# Patient Record
Sex: Female | Born: 1975 | Race: Black or African American | Hispanic: No | Marital: Married | Smoking: Never smoker
Health system: Southern US, Community
[De-identification: ages and names within clinical notes are randomized; demographics above are authoritative.]

## PROBLEM LIST (undated history)

## (undated) ENCOUNTER — Inpatient Hospital Stay (HOSPITAL_COMMUNITY): Payer: Self-pay

## (undated) DIAGNOSIS — R51 Headache: Secondary | ICD-10-CM

## (undated) DIAGNOSIS — F3281 Premenstrual dysphoric disorder: Secondary | ICD-10-CM

## (undated) DIAGNOSIS — F419 Anxiety disorder, unspecified: Secondary | ICD-10-CM

## (undated) DIAGNOSIS — K219 Gastro-esophageal reflux disease without esophagitis: Secondary | ICD-10-CM

## (undated) HISTORY — DX: Premenstrual dysphoric disorder: F32.81

## (undated) HISTORY — PX: NO PAST SURGERIES: SHX2092

---

## 2001-10-24 ENCOUNTER — Encounter: Payer: Self-pay | Admitting: Obstetrics

## 2001-10-24 ENCOUNTER — Ambulatory Visit (HOSPITAL_COMMUNITY): Admission: RE | Admit: 2001-10-24 | Discharge: 2001-10-24 | Payer: Self-pay | Admitting: Obstetrics

## 2001-12-20 ENCOUNTER — Ambulatory Visit (HOSPITAL_COMMUNITY): Admission: RE | Admit: 2001-12-20 | Discharge: 2001-12-20 | Payer: Self-pay | Admitting: Obstetrics

## 2001-12-20 ENCOUNTER — Encounter: Payer: Self-pay | Admitting: Obstetrics

## 2002-04-05 ENCOUNTER — Inpatient Hospital Stay (HOSPITAL_COMMUNITY): Admission: AD | Admit: 2002-04-05 | Discharge: 2002-04-05 | Payer: Self-pay | Admitting: Obstetrics

## 2002-04-08 ENCOUNTER — Encounter: Payer: Self-pay | Admitting: Obstetrics

## 2002-04-08 ENCOUNTER — Inpatient Hospital Stay (HOSPITAL_COMMUNITY): Admission: AD | Admit: 2002-04-08 | Discharge: 2002-04-08 | Payer: Self-pay | Admitting: Obstetrics

## 2002-05-13 ENCOUNTER — Inpatient Hospital Stay (HOSPITAL_COMMUNITY): Admission: AD | Admit: 2002-05-13 | Discharge: 2002-05-15 | Payer: Self-pay | Admitting: Obstetrics & Gynecology

## 2004-01-15 ENCOUNTER — Emergency Department (HOSPITAL_COMMUNITY): Admission: EM | Admit: 2004-01-15 | Discharge: 2004-01-15 | Payer: Self-pay | Admitting: Emergency Medicine

## 2010-12-02 ENCOUNTER — Other Ambulatory Visit: Payer: Self-pay | Admitting: Obstetrics

## 2011-07-07 ENCOUNTER — Ambulatory Visit (INDEPENDENT_AMBULATORY_CARE_PROVIDER_SITE_OTHER): Payer: PRIVATE HEALTH INSURANCE | Admitting: Family Medicine

## 2011-07-07 ENCOUNTER — Ambulatory Visit: Payer: PRIVATE HEALTH INSURANCE

## 2011-07-07 VITALS — BP 113/73 | HR 76 | Temp 98.9°F | Resp 12 | Ht 63.0 in | Wt 185.0 lb

## 2011-07-07 DIAGNOSIS — R221 Localized swelling, mass and lump, neck: Secondary | ICD-10-CM

## 2011-07-07 DIAGNOSIS — R220 Localized swelling, mass and lump, head: Secondary | ICD-10-CM

## 2011-07-07 MED ORDER — MELOXICAM 15 MG PO TABS: 15.0000 mg | ORAL_TABLET | Freq: Every day | ORAL | Status: DC

## 2011-07-07 NOTE — Progress Notes (Signed)
  Subjective:    Patient ID: Kirsten Thomas, female    DOB: 04-Aug-1975, 36 y.o.   MRN: [id number]  HPI Presents for evaluation of bilateral lower neck swelling.  "I've been sick on and off since last month with a cold."  Started again last week-ST, runny nose, tired/weak.  OTC products with some improvement, temporarily.  Cough worse at night.  NP at work gave her cough syrup (really strong, causes grogginess in AM).  Neck puffiness noticed by daughter this am.  Saw NP at work-drew bloodwork.  Doesn't know what tests.  Started on Prozac for PMDD several months ago.  Recent colonoscopy. Meds and allergies noted.  Review of Systems As above.  No chest pain, SOB, HA, dizziness, vision change, N/V, diarrhea, dysuria, myalgias, arthralgias or rash. URI-type symptoms are currently resolved.    Objective:   Physical Exam Vital signs noted. Well-developed, well nourished BF who is awake, alert and oriented, in NAD. HEENT: [place]/AT, PERRL, EOMI.  Sclera and conjunctiva are clear.  EAC are patent, TMs are normal in appearance. Nasal mucosa is pink and moist. OP is clear. Neck: supple, non-tender, no lymphadenopathy, thyromegaly.  Small fullness bilaterally at the base of the neck just above the clavicles. Heart: RRR, no murmur Lungs: CTA Abdomen: normo-active bowel sounds, supple, non-tender, no mass or organomegaly. Extremities: no cyanosis, clubbing or edema. Skin: warm and dry without rash.  Soft Tissue lateral neck: [facility] reading (PRIMARY) by  Dr. Hal Hope.  Normal soft tissue neck.     Assessment & Plan:  Neck swelling.  Likely supraclavicular fat pads.  Reassured by the plain film.  Await labs drawn at work.  Discussed with Dr. Hal Hope.

## 2011-07-07 NOTE — Patient Instructions (Signed)
Wait for the lab results from work.  If they are normal and your neck swelling persists, follow-up with your primary care provider or return here for re-evaluation.

## 2011-07-08 ENCOUNTER — Encounter: Payer: Self-pay | Admitting: Physician Assistant

## 2011-11-08 ENCOUNTER — Ambulatory Visit (INDEPENDENT_AMBULATORY_CARE_PROVIDER_SITE_OTHER): Payer: PRIVATE HEALTH INSURANCE | Admitting: Internal Medicine

## 2011-11-08 VITALS — BP 104/70 | HR 70 | Temp 99.7°F | Resp 16 | Ht 63.75 in | Wt 196.4 lb

## 2011-11-08 DIAGNOSIS — E669 Obesity, unspecified: Secondary | ICD-10-CM

## 2011-11-08 MED ORDER — PHENTERMINE HCL 37.5 MG PO CAPS: 37.5000 mg | ORAL_CAPSULE | ORAL | Status: AC

## 2011-11-08 NOTE — Progress Notes (Signed)
Eats when stressed.  Been awhile since she had weighed what she wants to.  Weighed 150 when she graduated high school.  Recently obtained a gym membership.  Eating habits are second helper type of person.  Needs help to get her motivated.  Weighs 1/2 lb less than she weighed when she gave birth.  Does have fatigue.  No other problems related to weight.  prozac and retin a, ortho trycicline.  Thyroid gland is good, no problems.  No swelling in legs.  Knee swells some.  Suggested changing machines at gym, swimming and step stool.  Advise to get small plate, 3 meals a day and no snacks.  No white food, no fried foods.  One hour a day of exercise.  Eating right and changing snack help.  Should be able to loose 3 to 4 lbs per month.  Patient was hoping for medicine to help loose weight.patient took phentermine several years ago and lost a lot of weight.  York Spaniel it gave her a boost to loose the weight.

## 2011-11-08 NOTE — Progress Notes (Signed)
  Subjective:    Patient ID: Kirsten Thomas, female    DOB: 07/16/1975, 36 y.o.   MRN: [id number]  HPIEats when stressed.  Been awhile since she had weighed what she wants to.  Weighed 150 when she graduated high school.  Recently obtained a gym membership.  Eating habits are second helper type of person.  Needs help to get her motivated.  Weighs 1/2 lb less than she weighed when she gave birth.  Does have fatigue.  No other problems related to weight.  prozac and retin a, ortho trycicline.  Thyroid gland is good, no problems.  No swelling in legs.  Knee swells some.  Suggested changing machines at gym, swimming and step stool.  Advise to get small plate, 3 meals a day and no snacks.  No white food, no fried foods.  One hour a day of exercise.  Eating right and changing snack help.  Should be able to loose 3 to 4 lbs per month.  Patient was hoping for medicine to help loose weight.patient took phentermine several years ago and lost a lot of weight.  York Spaniel it gave her a boost to loose the weight.     Review of SystemsNo endocrine or cardiac symptoms     Objective:   Physical Exam Vital signs stable except elevated BMI No thyromegaly or nodules Heart regular without murmur No peripheral edema       Assessment & Plan:  Obesity Meds ordered this encounter  Medications         . phentermine 37.5 MG capsule    Sig: Take 1 capsule (37.5 mg total) by mouth every morning.    Dispense:  30 capsule    Refill:  0  Diet small portions 3 times a day no secondary helpings now between meals snacks no fried foods and no white foods/ Will use phentermine only the first month $100 bet with husband One hour a day of exercise

## 2011-12-01 ENCOUNTER — Other Ambulatory Visit: Payer: Self-pay | Admitting: Obstetrics

## 2012-11-07 ENCOUNTER — Encounter: Payer: Self-pay | Admitting: Obstetrics

## 2012-12-23 ENCOUNTER — Other Ambulatory Visit: Payer: Self-pay | Admitting: Obstetrics

## 2012-12-24 NOTE — Telephone Encounter (Signed)
Please advise 

## 2012-12-27 ENCOUNTER — Encounter: Payer: Self-pay | Admitting: Obstetrics

## 2013-01-01 ENCOUNTER — Ambulatory Visit (INDEPENDENT_AMBULATORY_CARE_PROVIDER_SITE_OTHER): Payer: PRIVATE HEALTH INSURANCE | Admitting: Obstetrics

## 2013-01-01 ENCOUNTER — Encounter: Payer: Self-pay | Admitting: Obstetrics

## 2013-01-01 VITALS — BP 112/76 | HR 76 | Temp 98.2°F | Ht 63.5 in | Wt 204.2 lb

## 2013-01-01 DIAGNOSIS — Z01419 Encounter for gynecological examination (general) (routine) without abnormal findings: Secondary | ICD-10-CM

## 2013-01-01 DIAGNOSIS — Z0000 Encounter for general adult medical examination without abnormal findings: Secondary | ICD-10-CM

## 2013-01-01 MED ORDER — OXYCODONE-ACETAMINOPHEN 10-325 MG PO TABS: 1.0000 | ORAL_TABLET | Freq: Four times a day (QID) | ORAL | Status: DC | PRN

## 2013-01-01 MED ORDER — NORGESTIM-ETH ESTRAD TRIPHASIC 0.18/0.215/0.25 MG-35 MCG PO TABS: ORAL_TABLET | ORAL | Status: DC

## 2013-01-01 MED ORDER — FLUOXETINE HCL 10 MG PO TABS: 10.0000 mg | ORAL_TABLET | Freq: Every day | ORAL | Status: DC

## 2013-01-01 MED ORDER — ALPRAZOLAM 0.25 MG PO TABS: 0.2500 mg | ORAL_TABLET | Freq: Every evening | ORAL | Status: DC | PRN

## 2013-01-01 NOTE — Progress Notes (Signed)
.   Subjective:     Kirsten Thomas is a 37 y.o. female here for a routine exam.  No current complaints.  Personal health questionnaire reviewed: yes.   Gynecologic History Patient's last menstrual period was 12/27/2012. Contraception: OCP (estrogen/progesterone) Last Pap: 11/30/2012. Results were: normal Last mammogram: N/A  Obstetric History OB History  No data available     The following portions of the patient's history were reviewed and updated as appropriate: allergies, current medications, past family history, past medical history, past social history, past surgical history and problem list.  Review of Systems Pertinent items are noted in HPI.    Objective:    General appearance: alert and no distress  No exam.  Consult only.  Assessment:    Healthy female.   Plan:    Follow up in: 1 year.

## 2013-01-30 ENCOUNTER — Other Ambulatory Visit: Payer: Self-pay | Admitting: Obstetrics

## 2013-02-19 ENCOUNTER — Emergency Department (HOSPITAL_COMMUNITY)
Admission: EM | Admit: 2013-02-19 | Discharge: 2013-02-19 | Disposition: A | Discharge status: Home / Self Care | Payer: PRIVATE HEALTH INSURANCE | Attending: Emergency Medicine | Admitting: Emergency Medicine

## 2013-02-19 ENCOUNTER — Emergency Department (HOSPITAL_COMMUNITY): Payer: PRIVATE HEALTH INSURANCE

## 2013-02-19 ENCOUNTER — Encounter (HOSPITAL_COMMUNITY): Payer: Self-pay | Admitting: Emergency Medicine

## 2013-02-19 DIAGNOSIS — Z8742 Personal history of other diseases of the female genital tract: Secondary | ICD-10-CM | POA: Insufficient documentation

## 2013-02-19 DIAGNOSIS — R110 Nausea: Secondary | ICD-10-CM | POA: Insufficient documentation

## 2013-02-19 DIAGNOSIS — Z3202 Encounter for pregnancy test, result negative: Secondary | ICD-10-CM | POA: Insufficient documentation

## 2013-02-19 DIAGNOSIS — R109 Unspecified abdominal pain: Secondary | ICD-10-CM

## 2013-02-19 DIAGNOSIS — Z880 Allergy status to penicillin: Secondary | ICD-10-CM | POA: Insufficient documentation

## 2013-02-19 DIAGNOSIS — N201 Calculus of ureter: Secondary | ICD-10-CM | POA: Insufficient documentation

## 2013-02-19 DIAGNOSIS — N132 Hydronephrosis with renal and ureteral calculous obstruction: Secondary | ICD-10-CM

## 2013-02-19 DIAGNOSIS — Z79899 Other long term (current) drug therapy: Secondary | ICD-10-CM | POA: Insufficient documentation

## 2013-02-19 DIAGNOSIS — N1330 Unspecified hydronephrosis: Secondary | ICD-10-CM | POA: Insufficient documentation

## 2013-02-19 LAB — URINALYSIS, ROUTINE W REFLEX MICROSCOPIC
Bilirubin Urine: NEGATIVE
Glucose, UA: NEGATIVE mg/dL
Ketones, ur: 40 mg/dL — AB
Nitrite: NEGATIVE
Protein, ur: NEGATIVE mg/dL
Specific Gravity, Urine: 1.018 (ref 1.005–1.030)
Urobilinogen, UA: 0.2 mg/dL (ref 0.0–1.0)
pH: 7 (ref 5.0–8.0)

## 2013-02-19 LAB — CBC
HCT: 33.8 % — ABNORMAL LOW (ref 36.0–46.0)
Hemoglobin: 11.4 g/dL — ABNORMAL LOW (ref 12.0–15.0)
MCH: 29.4 pg (ref 26.0–34.0)
MCHC: 33.7 g/dL (ref 30.0–36.0)
MCV: 87.1 fL (ref 78.0–100.0)
Platelets: 278 K/uL (ref 150–400)
RBC: 3.88 MIL/uL (ref 3.87–5.11)
RDW: 12.6 % (ref 11.5–15.5)
WBC: 10.3 K/uL (ref 4.0–10.5)

## 2013-02-19 LAB — BASIC METABOLIC PANEL
Calcium: 8 mg/dL — ABNORMAL LOW (ref 8.4–10.5)
GFR calc Af Amer: >90 mL/min (ref >90)
GFR calc non Af Amer: >90 mL/min (ref >90)
Glucose, Bld: 118 mg/dL — ABNORMAL HIGH (ref 70–99)
Potassium: 3.7 mEq/L (ref 3.5–5.1)
Sodium: 137 mEq/L (ref 135–145)

## 2013-02-19 LAB — URINE MICROSCOPIC-ADD ON

## 2013-02-19 LAB — POCT PREGNANCY, URINE: Preg Test, Ur: NEGATIVE

## 2013-02-19 MED ORDER — OXYCODONE-ACETAMINOPHEN 5-325 MG PO TABS: 2.0000 | ORAL_TABLET | Freq: Four times a day (QID) | ORAL | Status: DC | PRN

## 2013-02-19 MED ORDER — ONDANSETRON HCL 4 MG PO TABS: 4.0000 mg | ORAL_TABLET | Freq: Four times a day (QID) | ORAL | Status: DC

## 2013-02-19 MED ORDER — TAMSULOSIN HCL 0.4 MG PO CAPS: 0.4000 mg | ORAL_CAPSULE | Freq: Every day | ORAL | Status: DC

## 2013-02-19 MED ORDER — SODIUM CHLORIDE 0.9 % IV BOLUS (SEPSIS)
1000.0000 mL | Freq: Once | INTRAVENOUS | Status: AC
  Administered 2013-02-19: 1000 mL via INTRAVENOUS

## 2013-02-19 MED ORDER — ONDANSETRON HCL 4 MG/2ML IJ SOLN
4.0000 mg | Freq: Once | INTRAMUSCULAR | Status: AC
  Administered 2013-02-19: 4 mg via INTRAVENOUS
  Filled 2013-02-19: qty 2

## 2013-02-19 MED ORDER — KETOROLAC TROMETHAMINE 30 MG/ML IJ SOLN
30.0000 mg | Freq: Once | INTRAMUSCULAR | Status: AC
  Administered 2013-02-19: 30 mg via INTRAVENOUS
  Filled 2013-02-19: qty 1

## 2013-02-19 MED ORDER — HYDROMORPHONE HCL PF 1 MG/ML IJ SOLN
1.0000 mg | Freq: Once | INTRAMUSCULAR | Status: AC
  Administered 2013-02-19: 1 mg via INTRAVENOUS
  Filled 2013-02-19: qty 1

## 2013-02-19 MED ORDER — HYDROCODONE-ACETAMINOPHEN 5-325 MG PO TABS: 2.0000 | ORAL_TABLET | Freq: Four times a day (QID) | ORAL | Status: DC | PRN

## 2013-02-19 MED ORDER — METHYLPREDNISOLONE SODIUM SUCC 125 MG IJ SOLR: 125.0000 mg | Freq: Once | INTRAMUSCULAR | Status: DC

## 2013-02-19 MED ORDER — FAMOTIDINE IN NACL 20-0.9 MG/50ML-% IV SOLN
20.0000 mg | Freq: Once | INTRAVENOUS | Status: AC
  Administered 2013-02-19: 20 mg via INTRAVENOUS
  Filled 2013-02-19: qty 50

## 2013-02-19 NOTE — ED Notes (Signed)
MD at bedside. 

## 2013-02-19 NOTE — ED Notes (Signed)
Pt alert and mentating appropriately upon d/c. Pt given d/c teaching, prescriptions and follow up care instructions. Pt has no further questions upon d/c. Notified Consulting civil engineer of prescription change via Dr Lavella Lemons. Pt ambulatory with steady gait and denies pain upon d/c. Pt instructed not to drive home and endorses her husband is coming to ER to pick her up. NAD noted upon d/c.

## 2013-02-19 NOTE — ED Notes (Signed)
Pt reports pain in abdomen area and left flank area; experiences nausea with some emesis; pt reports feeling the need to urinate more than usual. Pt reports hx UTI but denies hx of Kidney Stones; Pt onset is last evening

## 2013-02-19 NOTE — ED Notes (Signed)
Pt states left flank pain for the past day. Pt states difficulty urinating and that the pain gets better after she urinates. Pt states nausea as well.

## 2013-02-19 NOTE — ED Provider Notes (Signed)
CSN: [id number]     Arrival date & time 02/19/13  0239 History   First MD Initiated Contact with Patient 02/19/13 0251     Chief Complaint  Patient presents with  . Flank Pain   (Consider location/radiation/quality/duration/timing/severity/associated sxs/prior Treatment) HPI  This patient is a generally healthy 74 old woman who presents with approximately 8 hours of severe left flank pain radiating to left lower quadrant of the abdomen. Her pain is severe, 10 over 10. The patient has been unable to sleep secondary to pain. She denies history of similar symptoms. No recent trauma or strain. She's been nauseated but denies vomiting. No fever.  The patient has had to strain to pass urine but denies painful dysuria and gross hematuria. Her last menstrual period was approximately 2 weeks ago.   This patient is a generally healthy 6 her old woman whoPast Medical History  Diagnosis Date  . PMDD (premenstrual dysphoric disorder)    History reviewed. No pertinent past surgical history. History reviewed. No pertinent family history. History  Substance Use Topics  . Smoking status: Never Smoker   . Smokeless tobacco: Not on file  . Alcohol Use: Not on file   OB History   Grav Para Term Preterm Abortions TAB SAB Ect Mult Living                 Review of Systems Gen: no weight loss, fevers, chills, night sweats Eyes: no discharge or drainage, no occular pain or visual changes Nose: no epistaxis or rhinorrhea Mouth: no dental pain, no sore throat Neck: no neck pain Lungs: no SOB, cough, wheezing CV: no chest pain, palpitations, dependent edema or orthopnea Abd: As per history of present illness, otherwise negative GU: As per history of present illness, otherwise negative MSK: no myalgias or arthralgias Neuro: no headache, no focal neurologic deficits Skin: no rash Psyche: negative.  Allergies  Penicillins  Home Medications   Current Outpatient Rx  Name  Route  Sig  Dispense   Refill  . clindamycin-benzoyl peroxide (BENZACLIN) gel   Topical   Apply 1 application topically daily.         Marland Kitchen FLUoxetine (PROZAC) 10 MG tablet   Oral   Take 1 tablet (10 mg total) by mouth daily.   30 tablet   11   . Norgestimate-Ethinyl Estradiol Triphasic 0.18/0.215/0.25 MG-35 MCG tablet      Take 1 tablet po daily as directed.   1 Package   11    BP 117/78  Pulse 82  Temp(Src) 98.1 F (36.7 C) (Oral)  Resp 16  SpO2 99%  LMP 02/12/2013 Physical Exam Gen: well developed and well nourished appearing, appears uncomfortable Head: NCAT Eyes: PERL, EOMI Nose: no epistaixis or rhinorrhea Mouth/throat: mucosa is moist and pink Neck: supple, no stridor Lungs: CTA B, no wheezing, rhonchi or rales CV: Regular rate and rhythm, no murmur Abd: soft, notender, nondistended Back: no ttp, no cva ttp Skin: no rashese, wnl Neuro: CN ii-xii grossly intact, no focal deficits Psyche; normal affect,  calm and cooperative.   ED Course  Procedures (including critical care time) Labs Review  Results for orders placed during the hospital encounter of 02/19/13 (from the past 24 hour(s))  URINALYSIS, ROUTINE W REFLEX MICROSCOPIC     Status: Abnormal   Collection Time    02/19/13  2:42 AM      Result Value Range   Color, Urine YELLOW  YELLOW   APPearance CLEAR  CLEAR   Specific Gravity, Urine  1.018  1.005 - 1.030   pH 7.0  5.0 - 8.0   Glucose, UA NEGATIVE  NEGATIVE mg/dL   Hgb urine dipstick MODERATE (*) NEGATIVE   Bilirubin Urine NEGATIVE  NEGATIVE   Ketones, ur 40 (*) NEGATIVE mg/dL   Protein, ur NEGATIVE  NEGATIVE mg/dL   Urobilinogen, UA 0.2  0.0 - 1.0 mg/dL   Nitrite NEGATIVE  NEGATIVE   Leukocytes, UA SMALL (*) NEGATIVE  URINE MICROSCOPIC-ADD ON     Status: Abnormal   Collection Time    02/19/13  2:42 AM      Result Value Range   Squamous Epithelial / LPF FEW (*) RARE   WBC, UA 3-6  <3 WBC/hpf   RBC / HPF 7-10  <3 RBC/hpf   Bacteria, UA FEW (*) RARE   Urine-Other  MUCOUS PRESENT    POCT PREGNANCY, URINE     Status: None   Collection Time    02/19/13  2:51 AM      Result Value Range   Preg Test, Ur NEGATIVE  NEGATIVE  CBC     Status: Abnormal   Collection Time    02/19/13  5:15 AM      Result Value Range   WBC 10.3  4.0 - 10.5 K/uL   RBC 3.88  3.87 - 5.11 MIL/uL   Hemoglobin 11.4 (*) 12.0 - 15.0 g/dL   HCT 30.8 (*) 65.7 - 84.6 %   MCV 87.1  78.0 - 100.0 fL   MCH 29.4  26.0 - 34.0 pg   MCHC 33.7  30.0 - 36.0 g/dL   RDW 96.2  95.2 - 84.1 %   Platelets 278  150 - 400 K/uL  BASIC METABOLIC PANEL     Status: Abnormal   Collection Time    02/19/13  5:15 AM      Result Value Range   Sodium 137  135 - 145 mEq/L   Potassium 3.7  3.5 - 5.1 mEq/L   Chloride 99  96 - 112 mEq/L   CO2 24  19 - 32 mEq/L   Glucose, Bld 118 (*) 70 - 99 mg/dL   BUN 11  6 - 23 mg/dL   Creatinine, Ser 3.24  0.50 - 1.10 mg/dL   Calcium 8.0 (*) 8.4 - 10.5 mg/dL   GFR calc non Af Amer >90  >90 mL/min   GFR calc Af Amer >90  >90 mL/min     Imaging Review  EXAM: CT ABDOMEN AND PELVIS WITHOUT CONTRAST  TECHNIQUE: Multidetector CT imaging of the abdomen and pelvis was performed following the standard protocol without intravenous contrast.  COMPARISON: None.  FINDINGS: BODY WALL: Unremarkable.  LOWER CHEST:  Mediastinum: Unremarkable.  Lungs/pleura: No consolidation.  ABDOMEN/PELVIS:  Liver: No focal abnormality.  Biliary: No evidence of biliary obstruction or stone.  Pancreas: Unremarkable.  Spleen: Unremarkable.  Adrenals: Unremarkable.  Kidneys and ureters: Left hydroureteronephrosis with left renomegaly and extensive left perinephric edema- secondary to a 6 mm left ureteral vesicular stone. No intrarenal calculi seen.  Bladder: Unremarkable.  Bowel: No obstruction. Normal appendix.  Retroperitoneum: No mass or adenopathy.  Peritoneum: No free fluid or gas.  Reproductive: Dominant follicle in the left ovary, 2.3 cm.  Vascular: No acute  abnormality.  OSSEOUS: No acute abnormalities. No suspicious lytic or blastic lesions.  IMPRESSION: 1. 6 mm left ureteral vesicular junction calculus with obstructive uropathy. 2. No intrarenal calculi.   Electronically Signed By: Tiburcio Pea M.D. On: 02/19/2013 04:50    EKG Interpretation  None       MDM  DDX: renal colic, pyelonephritis, UTI, ovarian cyst, ovarian torsion, colitis, PID.   Patient with obstructive UVJ stone. Normal renal function. Feeling much better after tx with IV analgesia, IVF, antiemetic. I have informed the patient of her diagnosis, plan for outpatient tx and Urology follow up. Case briefly discussed with Dr. Margarita Grizzle, Urologist on call. He will be happy to see the patient in follow up this week.   Patient counseled re: return precautions.       Brandt Loosen, MD 02/19/13 [phone]

## 2013-02-20 LAB — URINE CULTURE
Colony Count: NO GROWTH
Culture: NO GROWTH

## 2013-02-22 ENCOUNTER — Other Ambulatory Visit: Payer: Self-pay | Admitting: Urology

## 2013-02-28 ENCOUNTER — Encounter (HOSPITAL_COMMUNITY): Payer: Self-pay | Admitting: Pharmacy Technician

## 2013-03-04 ENCOUNTER — Inpatient Hospital Stay (HOSPITAL_COMMUNITY): Admission: RE | Admit: 2013-03-04 | Payer: PRIVATE HEALTH INSURANCE | Source: Ambulatory Visit

## 2013-03-05 ENCOUNTER — Encounter (HOSPITAL_COMMUNITY): Payer: Self-pay

## 2013-03-05 ENCOUNTER — Encounter (HOSPITAL_COMMUNITY)
Admission: RE | Admit: 2013-03-05 | Discharge: 2013-03-05 | Disposition: A | Discharge status: Home / Self Care | Payer: PRIVATE HEALTH INSURANCE | Source: Ambulatory Visit | Attending: Urology | Admitting: Urology

## 2013-03-05 HISTORY — DX: Anxiety disorder, unspecified: F41.9

## 2013-03-05 HISTORY — DX: Gastro-esophageal reflux disease without esophagitis: K21.9

## 2013-03-05 HISTORY — DX: Headache: R51

## 2013-03-05 LAB — BASIC METABOLIC PANEL
BUN: 11 mg/dL (ref 6–23)
Chloride: 103 mEq/L (ref 96–112)
GFR calc Af Amer: >90 mL/min (ref >90)
GFR calc non Af Amer: >90 mL/min (ref >90)
Glucose, Bld: 82 mg/dL (ref 70–99)
Potassium: 3.7 mEq/L (ref 3.5–5.1)

## 2013-03-05 NOTE — Patient Instructions (Signed)
Kirsten Thomas  03/05/2013   Your procedure is scheduled on: 03/07/13  Report to [facility] at 8:30 AM.  Call this number if you have problems the morning of surgery [phone]: [phone]   Remember:   Do not eat food or drink liquids After Midnight.     Take these medicines the morning of surgery with A SIP OF WATER: prozac   Do not wear jewelry, make-up or nail polish.  Do not wear lotions, powders, or perfumes. You may wear deodorant.  Do not shave 48 hours prior to surgery. Men may shave face and neck.  Do not bring valuables to the hospital.  Contacts, dentures or bridgework may not be worn into surgery.   Patients discharged the day of surgery will not be allowed to drive home.  Name and phone number of your driver: [name]    Birdie Sons, RN  pre op nurse call if needed [phone]    FAILURE TO FOLLOW THESE INSTRUCTIONS MAY RESULT IN CANCELLATION OF YOUR SURGERY   Patient Signature: ___________________________________________

## 2013-03-05 NOTE — Progress Notes (Signed)
CBC and urine preg 02/19/13 on EPIC

## 2013-03-06 NOTE — H&P (Signed)
Chief Complaint  Kidney stone   History of Present Illness  Kirsten Thomas is a very pleasant 37 year old female who began developing intermittent left-sided flank pain approximately 2 weeks ago.  She then remained pain-free until Monday when she developed severe nausea and vomiting and severe left-sided flank pain causing her to present to the emergency department early Tuesday morning.  She underwent a CT scan of the abdomen and pelvis without contrast which demonstrated a 6 mm left distal ureteral calculus.  No other stones were identified.  She has no prior history of kidney stones.  She was administered pain medication and discharged home on Percocet and tamsulosin for medical expulsion therapy.  Her pain has been relatively well controlled since her discharge and she has been tolerating oral intake.  She does have some mild dysuria and straining to urinate.  There is no family history of kidney stones.  She currently denies any fever, nausea, or vomiting.   Past Medical History Problems  1. History of  Anxiety (Symptom) 300.00 2. History of  Esophageal Reflux 530.81 3. History of  Premenstrual Disorder 625.4  Surgical History Problems  1. History of  No Surgical Problems  Current Meds 1. Clindamycin Phosphate 1 % External Gel; Therapy: (Recorded:15Oct2014) to 2. Ortho Tri-Cyclen (28) 0.18/0.215/0.25 MG-35 MCG Oral Tablet; Therapy:  (Recorded:15Oct2014) to 3. PROzac 10 MG Oral Capsule; Therapy: (Recorded:15Oct2014) to  Allergies Medication  1. Penicillins  Family History Problems  1. Family history of  Diabetes Mellitus V18.0 2. Maternal history of  Paranoid Schizophrenia  Social History Problems    Alcohol Use up to 3 glasses of wine weekly; not an every day drinker   Caffeine Use 1 or 2 servings daily   Marital History - Currently Married   Never A Smoker  Review of Systems Genitourinary, constitutional, skin, eye, otolaryngeal, hematologic/lymphatic, cardiovascular,  pulmonary, endocrine, musculoskeletal, gastrointestinal, neurological and psychiatric system(s) were reviewed and pertinent findings if present are noted.  Genitourinary: no hematuria.  Gastrointestinal: nausea, vomiting and heartburn.  Constitutional: feeling tired (fatigue) and recent weight loss.  Respiratory: shortness of breath and cough.  Musculoskeletal: back pain.  Neurological: dizziness.    Vitals Vital Signs [Data Includes: Last 1 Day]  15Oct2014 11:09AM  BMI Calculated: 34.55 BSA Calculated: 1.91 Height: 5 ft 3 in Weight: 195 lb  Blood Pressure: 104 / 69 Temperature: 98.9 F Heart Rate: 88  Physical Exam Constitutional: Well nourished and well developed . No acute distress.  ENT:. The ears and nose are normal in appearance.  Neck: The appearance of the neck is normal and no neck mass is present.  Pulmonary: No respiratory distress, normal respiratory rhythm and effort and clear bilateral breath sounds.  Cardiovascular: Heart rate and rhythm are normal . No peripheral edema.  Abdomen: The abdomen is soft and nontender. No masses are palpated. moderate left CVA tenderness. No hernias are palpable. No hepatosplenomegaly noted.  Lymphatics: The femoral and inguinal nodes are not enlarged or tender.  Skin: Normal skin turgor, no visible rash and no visible skin lesions.  Neuro/Psych:. Mood and affect are appropriate.    Results/Data Urine [Data Includes: Last 1 Day]   15Oct2014  COLOR YELLOW   APPEARANCE CLOUDY   SPECIFIC GRAVITY 1.010   pH 6.0   GLUCOSE NEG mg/dL  BILIRUBIN NEG   KETONE NEG mg/dL  BLOOD LARGE   PROTEIN NEG mg/dL  UROBILINOGEN 0.2 mg/dL  NITRITE NEG   LEUKOCYTE ESTERASE SMALL   SQUAMOUS EPITHELIAL/HPF MANY   WBC 11-20 WBC/hpf  RBC 3-6 RBC/hpf  BACTERIA MODERATE   CRYSTALS NONE SEEN   CASTS NONE SEEN     I reviewed her medical records including her labs from the hospital.  Her creatinine 0.78, white blood count 10 point 3, urine pregnancy was  negative.  Urinalysis demonstrated 3-6 white blood cells, 7 and 10 red blood cells but her culture was negative.  I independently reviewed her CT scan findings as dictated above.   Assessment Assessed  1. Ureteral Stone 592.1  Plan Health Maintenance (V70.0)  1. UA With REFLEX  Done: 15Oct2014 10:53AM Ureteral Stone (592.1)  2. Oxycodone-Acetaminophen 5-325 MG Oral Tablet; Take 1-2 tablets every 4-6 hrs;  Therapy: 15Oct2014 to (Complete:14Nov2014); Last Rx:15Oct2014  Discussion/Summary  1.  Distal left ureteral calculus: We reviewed options including medical expulsion, shockwave lithotripsy, ureteroscopic laser lithotripsy, and percutaneous approaches to treatment of kidney stones.  We reviewed her incontinence of each approach.  She does elect to proceed with an initial course of medical expulsion therapy and will strain her urine.  She will continue tamsulosin and has been provided a prescription for Percocet.  She will tentatively be scheduled for left ureteroscopic laser lithotripsy and stone removal in 2 weeks if she does not pass her stone.  We have reviewed the potential risks and consultation for this procedure as well as the expected recovery process.  She will notify should she develop fever or unrelenting pain prior to that time.  Cc: Dr. Laurann Montana     Signatures Electronically signed by : Heloise Purpura, M.D.; Feb 20 2013 12:09PM

## 2013-03-07 ENCOUNTER — Ambulatory Visit (HOSPITAL_COMMUNITY): Payer: PRIVATE HEALTH INSURANCE | Admitting: Anesthesiology

## 2013-03-07 ENCOUNTER — Ambulatory Visit (HOSPITAL_COMMUNITY)
Admission: RE | Admit: 2013-03-07 | Discharge: 2013-03-07 | Disposition: A | Discharge status: Home / Self Care | Payer: PRIVATE HEALTH INSURANCE | Source: Ambulatory Visit | Attending: Urology | Admitting: Urology

## 2013-03-07 ENCOUNTER — Encounter (HOSPITAL_COMMUNITY): Payer: PRIVATE HEALTH INSURANCE | Admitting: Anesthesiology

## 2013-03-07 ENCOUNTER — Encounter (HOSPITAL_COMMUNITY)
Admission: RE | Disposition: A | Discharge status: Home / Self Care | Payer: Self-pay | Source: Ambulatory Visit | Attending: Urology

## 2013-03-07 ENCOUNTER — Encounter (HOSPITAL_COMMUNITY): Payer: Self-pay | Admitting: Anesthesiology

## 2013-03-07 DIAGNOSIS — K219 Gastro-esophageal reflux disease without esophagitis: Secondary | ICD-10-CM | POA: Insufficient documentation

## 2013-03-07 DIAGNOSIS — N201 Calculus of ureter: Secondary | ICD-10-CM | POA: Insufficient documentation

## 2013-03-07 HISTORY — PX: HOLMIUM LASER APPLICATION: SHX5852

## 2013-03-07 HISTORY — PX: CYSTOSCOPY WITH RETROGRADE PYELOGRAM, URETEROSCOPY AND STENT PLACEMENT: SHX5789

## 2013-03-07 SURGERY — CYSTOURETEROSCOPY, WITH RETROGRADE PYELOGRAM AND STENT INSERTION
Anesthesia: General | Laterality: Left | Wound class: Clean Contaminated

## 2013-03-07 MED ORDER — FENTANYL CITRATE 0.05 MG/ML IJ SOLN
INTRAMUSCULAR | Status: DC | PRN
  Administered 2013-03-07 (×2): 25 ug via INTRAVENOUS
  Administered 2013-03-07: 50 ug via INTRAVENOUS

## 2013-03-07 MED ORDER — HYDROCODONE-ACETAMINOPHEN 5-325 MG PO TABS: 1.0000 | ORAL_TABLET | Freq: Four times a day (QID) | ORAL | Status: DC | PRN

## 2013-03-07 MED ORDER — SODIUM CHLORIDE 0.9 % IR SOLN
Status: DC | PRN
  Administered 2013-03-07: 3000 mL

## 2013-03-07 MED ORDER — CIPROFLOXACIN IN D5W 400 MG/200ML IV SOLN
400.0000 mg | INTRAVENOUS | Status: AC
  Administered 2013-03-07: 400 mg via INTRAVENOUS

## 2013-03-07 MED ORDER — ONDANSETRON HCL 4 MG/2ML IJ SOLN
INTRAMUSCULAR | Status: DC | PRN
  Administered 2013-03-07: 4 mg via INTRAVENOUS

## 2013-03-07 MED ORDER — 0.9 % SODIUM CHLORIDE (POUR BTL) OPTIME
TOPICAL | Status: DC | PRN
  Administered 2013-03-07: 1000 mL

## 2013-03-07 MED ORDER — MEPERIDINE HCL 50 MG/ML IJ SOLN: 6.2500 mg | INTRAMUSCULAR | Status: DC | PRN

## 2013-03-07 MED ORDER — MIDAZOLAM HCL 5 MG/5ML IJ SOLN
INTRAMUSCULAR | Status: DC | PRN
  Administered 2013-03-07 (×2): 1 mg via INTRAVENOUS

## 2013-03-07 MED ORDER — CIPROFLOXACIN IN D5W 400 MG/200ML IV SOLN
INTRAVENOUS | Status: AC
  Filled 2013-03-07: qty 200

## 2013-03-07 MED ORDER — LACTATED RINGERS IV SOLN
INTRAVENOUS | Status: DC | PRN
  Administered 2013-03-07: 10:00:00 via INTRAVENOUS

## 2013-03-07 MED ORDER — PHENYLEPHRINE HCL 10 MG/ML IJ SOLN
INTRAMUSCULAR | Status: DC | PRN
  Administered 2013-03-07 (×4): 80 ug via INTRAVENOUS

## 2013-03-07 MED ORDER — LACTATED RINGERS IV SOLN: INTRAVENOUS | Status: DC

## 2013-03-07 MED ORDER — HYDROCODONE-ACETAMINOPHEN 5-325 MG PO TABS
1.0000 | ORAL_TABLET | Freq: Four times a day (QID) | ORAL | Status: DC | PRN
  Administered 2013-03-07: 2 via ORAL
  Filled 2013-03-07: qty 2

## 2013-03-07 MED ORDER — PROMETHAZINE HCL 25 MG/ML IJ SOLN: 6.2500 mg | INTRAMUSCULAR | Status: DC | PRN

## 2013-03-07 MED ORDER — SODIUM CHLORIDE 0.9 % IR SOLN
Status: DC | PRN
  Administered 2013-03-07: 1000 mL

## 2013-03-07 MED ORDER — IOHEXOL 300 MG/ML  SOLN
INTRAMUSCULAR | Status: DC | PRN
  Administered 2013-03-07: 10 mL

## 2013-03-07 MED ORDER — PROPOFOL 10 MG/ML IV BOLUS
INTRAVENOUS | Status: DC | PRN
  Administered 2013-03-07: 180 mg via INTRAVENOUS

## 2013-03-07 MED ORDER — FENTANYL CITRATE 0.05 MG/ML IJ SOLN: 25.0000 ug | INTRAMUSCULAR | Status: DC | PRN

## 2013-03-07 SURGICAL SUPPLY — 16 items
BAG URO CATCHER STRL LF (DRAPE) ×2 IMPLANT
BASKET ZERO TIP NITINOL 2.4FR (BASKET) ×2 IMPLANT
CATH INTERMIT  6FR 70CM (CATHETERS) ×2 IMPLANT
CLOTH BEACON ORANGE TIMEOUT ST (SAFETY) ×2 IMPLANT
DRAPE CAMERA CLOSED 9X96 (DRAPES) ×2 IMPLANT
FIBER LASER FLEXIVA 365 (UROLOGICAL SUPPLIES) ×2 IMPLANT
GLOVE BIOGEL M STRL SZ7.5 (GLOVE) ×2 IMPLANT
GLOVE BIOGEL PI IND STRL 7.0 (GLOVE) ×1 IMPLANT
GLOVE BIOGEL PI INDICATOR 7.0 (GLOVE) ×1
GOWN PREVENTION PLUS LG XLONG (DISPOSABLE) ×4 IMPLANT
GOWN STRL NON-REIN LRG LVL3 (GOWN DISPOSABLE) ×2 IMPLANT
GUIDEWIRE ANG ZIPWIRE 038X150 (WIRE) IMPLANT
GUIDEWIRE STR DUAL SENSOR (WIRE) ×2 IMPLANT
MANIFOLD NEPTUNE II (INSTRUMENTS) ×2 IMPLANT
PACK CYSTO (CUSTOM PROCEDURE TRAY) ×2 IMPLANT
TUBING CONNECTING 10 (TUBING) ×2 IMPLANT

## 2013-03-07 NOTE — Op Note (Signed)
Preoperative diagnosis: Left distal ureteral calculus  Postoperative diagnosis: Left distal ureteral calculus  Procedure:  1. Cystoscopy 2. Left ureteroscopy and stone removal 3. Ureteroscopic laser lithotripsy 4. Left retrograde pyelography with interpretation  Surgeon: Moody Bruins. M.D.  Anesthesia: General  Complications: None  Intraoperative findings: Left retrograde pyelography demonstrated a filling defect within the distal left ureter consistent with the patient's known calculus without other abnormalities.  EBL: Minimal  Specimens: 1. Left ureteral calculus  Disposition of specimens: [facility] for stone analysis  Indication: Kirsten Thomas is a 37 y.o. year old patient with a distal left ureteral stone. After reviewing the management options for treatment, the patient elected to proceed with the above surgical procedure(s). We have discussed the potential benefits and risks of the procedure, side effects of the proposed treatment, the likelihood of the patient achieving the goals of the procedure, and any potential problems that might occur during the procedure or recuperation. Informed consent has been obtained.  Description of procedure:  The patient was taken to the operating room and general anesthesia was induced.  The patient was placed in the dorsal lithotomy position, prepped and draped in the usual sterile fashion, and preoperative antibiotics were administered. A preoperative time-out was performed.   Cystourethroscopy was performed.  The patient's urethra was examined and was normal. The bladder was then systematically examined in its entirety. There was no evidence for any bladder tumors, stones, or other mucosal pathology.    Attention then turned to the left ureteral orifice and a ureteral catheter was used to intubate the ureteral orifice.  Omnipaque contrast was injected through the ureteral catheter and a retrograde pyelogram  was performed with findings as dictated above.  A 0.38 sensor guidewire was then advanced up the left ureter into the renal pelvis under fluoroscopic guidance. The 6 Fr semirigid ureteroscope was then advanced into the ureter next to the guidewire and the calculus was identified within the distal left ureter.   The stone was then fragmented with the 365 micron holmium laser fiber on a setting of 0.5 J and frequency of 20 Hz.   All stones were then removed from the ureter with a zero tip nitinol basket.  Reinspection of the ureter revealed no remaining visible stones or fragments.   The wire was then backloaded through the cystoscope and a ureteral stent was advance over the wire using Seldinger technique.  The stent was positioned appropriately under fluoroscopic and cystoscopic guidance.  The wire was then removed with an adequate stent curl noted in the renal pelvis as well as in the bladder.  The bladder was then emptied and the procedure ended.  The patient appeared to tolerate the procedure well and without complications.  The patient was able to be awakened and transferred to the recovery unit in satisfactory condition.

## 2013-03-07 NOTE — Anesthesia Postprocedure Evaluation (Signed)
  Anesthesia Post-op Note  Patient: Kirsten Thomas  Procedure(s) Performed: Procedure(s) (LRB): CYSTOSCOPY WITH RETROGRADE PYELOGRAM, URETEROSCOPY, STONE REMOVAL  (Left)  HOLMIUM LASER APPLICATION (Left)  Patient Location: PACU  Anesthesia Type: General  Level of Consciousness: awake and alert   Airway and Oxygen Therapy: Patient Spontanous Breathing  Post-op Pain: mild  Post-op Assessment: Post-op Vital signs reviewed, Patient's Cardiovascular Status Stable, Respiratory Function Stable, Patent Airway and No signs of Nausea or vomiting  Last Vitals:  Filed Vitals:   03/07/13 1325  BP: 118/68  Pulse: 80  Temp:   Resp: 18    Post-op Vital Signs: stable   Complications: No apparent anesthesia complications

## 2013-03-07 NOTE — Interval H&P Note (Signed)
History and Physical Interval Note:  03/07/2013 10:23 AM  Kirsten Thomas  has presented today for surgery, with the diagnosis of LEFT URETERAL STONE  The various methods of treatment have been discussed with the patient and family. After consideration of risks, benefits and other options for treatment, the patient has consented to  Procedure(s): CYSTOSCOPY WITH RETROGRADE PYELOGRAM, URETEROSCOPY AND STENT PLACEMENT (Left) POSSIBLE HOLMIUM LASER APPLICATION (Left) as a surgical intervention .  The patient's history has been reviewed, patient examined, no change in status, stable for surgery.  I have reviewed the patient's chart and labs.  Questions were answered to the patient's satisfaction.     Wetzel Meester,LES

## 2013-03-07 NOTE — Transfer of Care (Signed)
Immediate Anesthesia Transfer of Care Note  Patient: Kirsten Thomas  Procedure(s) Performed: Procedure(s): CYSTOSCOPY WITH RETROGRADE PYELOGRAM, URETEROSCOPY, STONE REMOVAL  (Left)  HOLMIUM LASER APPLICATION (Left)  Patient Location: PACU  Anesthesia Type:General  Level of Consciousness: awake, alert , oriented and patient cooperative  Airway & Oxygen Therapy: Patient Spontanous Breathing and Patient connected to face mask oxygen  Post-op Assessment: Report given to PACU RN and Post -op Vital signs reviewed and stable  Post vital signs: Reviewed and stable  Complications: No apparent anesthesia complications

## 2013-03-07 NOTE — Anesthesia Preprocedure Evaluation (Signed)
Anesthesia Evaluation  Patient identified by MRN, date of birth, ID band Patient awake    Reviewed: Allergy & Precautions, H&P , NPO status , Patient's Chart, lab work & pertinent test results  Airway Mallampati: II TM Distance: >3 FB Neck ROM: Full    Dental no notable dental hx. (+) Teeth Intact   Pulmonary neg pulmonary ROS,  breath sounds clear to auscultation  Pulmonary exam normal       Cardiovascular negative cardio ROS  Rhythm:Regular Rate:Normal     Neuro/Psych negative neurological ROS  negative psych ROS   GI/Hepatic negative GI ROS, Neg liver ROS,   Endo/Other  negative endocrine ROS  Renal/GU negative Renal ROS  negative genitourinary   Musculoskeletal negative musculoskeletal ROS (+)   Abdominal   Peds negative pediatric ROS (+)  Hematology negative hematology ROS (+)   Anesthesia Other Findings   Reproductive/Obstetrics negative OB ROS                           Anesthesia Physical Anesthesia Plan  ASA: II  Anesthesia Plan: General   Post-op Pain Management:    Induction: Intravenous  Airway Management Planned: LMA  Additional Equipment:   Intra-op Plan:   Post-operative Plan: Extubation in OR  Informed Consent: I have reviewed the patients History and Physical, chart, labs and discussed the procedure including the risks, benefits and alternatives for the proposed anesthesia with the patient or authorized representative who has indicated his/her understanding and acceptance.   Dental advisory given  Plan Discussed with: CRNA  Anesthesia Plan Comments:         Anesthesia Quick Evaluation

## 2013-03-08 ENCOUNTER — Encounter (HOSPITAL_COMMUNITY): Payer: Self-pay | Admitting: Urology

## 2013-06-12 ENCOUNTER — Encounter: Payer: Self-pay | Admitting: Obstetrics

## 2013-07-17 ENCOUNTER — Encounter: Payer: Self-pay | Admitting: Obstetrics

## 2013-07-18 ENCOUNTER — Other Ambulatory Visit: Payer: Self-pay | Admitting: Obstetrics

## 2013-07-23 ENCOUNTER — Encounter: Payer: Self-pay | Admitting: Obstetrics

## 2014-01-31 ENCOUNTER — Other Ambulatory Visit: Payer: Self-pay | Admitting: Obstetrics

## 2014-04-21 ENCOUNTER — Ambulatory Visit: Payer: PRIVATE HEALTH INSURANCE | Admitting: Obstetrics

## 2014-05-13 ENCOUNTER — Telehealth: Payer: Self-pay | Admitting: *Deleted

## 2014-05-13 NOTE — Telephone Encounter (Signed)
Patient is requesting a refill of her birth control. 05/13/2014 11:10 LM on VM - will RF x3- but patient needs to have AEX/pap- please call to schedule.

## 2014-07-07 ENCOUNTER — Ambulatory Visit (INDEPENDENT_AMBULATORY_CARE_PROVIDER_SITE_OTHER): Payer: Commercial Managed Care - PPO | Admitting: Obstetrics

## 2014-07-07 ENCOUNTER — Encounter: Payer: Self-pay | Admitting: Obstetrics

## 2014-07-07 VITALS — BP 119/72 | HR 105 | Temp 98.3°F | Ht 63.5 in | Wt 212.0 lb

## 2014-07-07 DIAGNOSIS — E669 Obesity, unspecified: Secondary | ICD-10-CM | POA: Diagnosis not present

## 2014-07-07 DIAGNOSIS — Z3169 Encounter for other general counseling and advice on procreation: Secondary | ICD-10-CM | POA: Diagnosis not present

## 2014-07-08 ENCOUNTER — Encounter: Payer: Self-pay | Admitting: Obstetrics

## 2014-07-08 NOTE — Progress Notes (Addendum)
Subjective:        Kirsten Thomas is a 39 y.o. female here for a routine exam.  Current complaints: None.    Personal health questionnaire:  Is patient [name], have a family history of breast and/or ovarian cancer: no Is there a family history of uterine cancer diagnosed at age < 9250, gastrointestinal cancer, urinary tract cancer, family member who is a Personnel officerLynch syndrome-associated carrier: no Is the patient overweight and hypertensive, family history of diabetes, personal history of gestational diabetes, preeclampsia or PCOS: no Is patient over 7655, have PCOS,  family history of premature CHD under age 38, diabetes, smoke, have hypertension or peripheral artery disease:  no At any time, has a partner hit, kicked or otherwise hurt or frightened you?: no Over the past 2 weeks, have you felt down, depressed or hopeless?: no Over the past 2 weeks, have you felt little interest or pleasure in doing things?:no   Gynecologic History Patient's last menstrual period was 06/20/2014. Contraception: none Last Pap: 2015. Results were: normal Last mammogram: n/a. Results were: n/a  Obstetric History OB History  Gravida Para Term Preterm AB SAB TAB Ectopic Multiple Living  3 3 3       3     # Outcome Date GA Lbr Len/2nd Weight Sex Delivery Anes PTL Lv  3 Term 05/13/02 493w0d  7 lb 6 oz (3.345 kg) M Vag-Spont None N Y  2 Term 03/26/96 3193w0d  6 lb 10 oz (3.005 kg) F Vag-Spont EPI N Y  1 Term 07/27/91 7393w0d  6 lb 10.5 oz (3.019 kg) F Vag-Spont None N Y      Past Medical History  Diagnosis Date  . PMDD (premenstrual dysphoric disorder)   . Anxiety   . GERD (gastroesophageal reflux disease)     occasional with food  . Headache(784.0)     migraines    Past Surgical History  Procedure Laterality Date  . No past surgeries    . Cystoscopy with retrograde pyelogram, ureteroscopy and stent placement Left 03/07/2013    Procedure: CYSTOSCOPY WITH RETROGRADE PYELOGRAM, URETEROSCOPY,  STONE REMOVAL ;  Surgeon: Crecencio McLes Borden, MD;  Location: [facility];  Service: Urology;  Laterality: Left;  . Holmium laser application Left 03/07/2013    Procedure:  HOLMIUM LASER APPLICATION;  Surgeon: Crecencio McLes Borden, MD;  Location: [facility];  Service: Urology;  Laterality: Left;     Current outpatient prescriptions:  .  clindamycin-benzoyl peroxide (BENZACLIN) gel, Apply 1 application topically daily., Disp: , Rfl:  .  FLUoxetine (PROZAC) 10 MG capsule, Take 20 mg by mouth every morning. , Disp: , Rfl:  .  L-Methylfolate (DEPLIN) 15 MG TABS, Take by mouth., Disp: , Rfl:  Allergies  Allergen Reactions  . Penicillins Hives    History  Substance Use Topics  . Smoking status: Never Smoker   . Smokeless tobacco: Never Used  . Alcohol Use: 0.0 oz/week    0 Standard drinks or equivalent per week     Comment: socially    History reviewed. No pertinent family history.    Review of Systems  Constitutional: negative for fatigue and weight loss Respiratory: negative for cough and wheezing Cardiovascular: negative for chest pain, fatigue and palpitations Gastrointestinal: negative for abdominal pain and change in bowel habits Musculoskeletal:negative for myalgias Neurological: negative for gait problems and tremors Behavioral/Psych: negative for abusive relationship, depression Endocrine: negative for temperature intolerance   Genitourinary:negative for abnormal menstrual periods, genital lesions, hot flashes, sexual problems and vaginal discharge Integument/breast:  negative for breast lump, breast tenderness, nipple discharge and skin lesion(s)    Objective:       BP 119/72 mmHg  Pulse 105  Temp(Src) 98.3 F (36.8 C)  Ht 5' 3.5" (1.613 m)  Wt 212 lb (96.163 kg)  BMI 36.96 kg/m2  LMP 06/20/2014   PE:  Deferred  Lab Review Urine pregnancy test Labs reviewed yes Radiologic studies reviewed yes  100% of 10 min visit spent on counseling and coordination of care.   Assessment:     Preconception Counseling  Obesity   Plan:    Education reviewed: low fat, low cholesterol diet and importance of healthy diet and lifestyle before pregnancy.  Continue PNV's. Follow up in: 6 months.   Meds ordered this encounter  Medications  . L-Methylfolate (DEPLIN) 15 MG TABS    Sig: Take by mouth.   No orders of the defined types were placed in this encounter.

## 2014-07-16 ENCOUNTER — Ambulatory Visit: Payer: Commercial Managed Care - PPO | Admitting: Certified Nurse Midwife

## 2014-07-17 ENCOUNTER — Ambulatory Visit: Payer: Commercial Managed Care - PPO | Admitting: Certified Nurse Midwife

## 2015-01-06 ENCOUNTER — Ambulatory Visit: Payer: Commercial Managed Care - PPO | Admitting: Obstetrics

## 2015-03-02 IMAGING — CT CT ABD-PELV W/O CM
2 of 4 series · 16 of 46 positions shown, 18 images · non-contrast
Comparison: None.

CLINICAL DATA: Left flank pain.

EXAM:
CT ABDOMEN AND PELVIS WITHOUT CONTRAST
TECHNIQUE: Multidetector CT imaging of the abdomen and pelvis was performed
following the standard protocol without intravenous contrast.

[Series 2: stone study 5.0 i30f 1 · axial · 0.86mm/px · z∈[+802,+1222]mm · 13 of 92 slices shown, 15 images]
[im 4/92  soft-tissue]
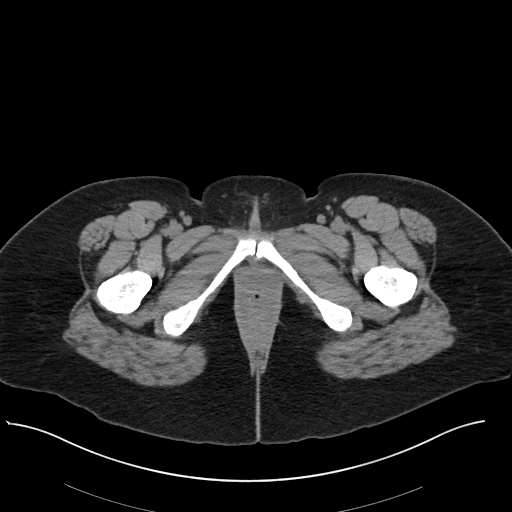
[im 4/92  bone]
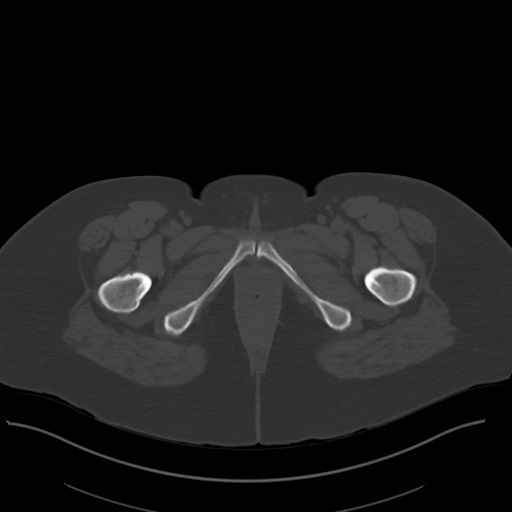
[im 12/92  soft-tissue]
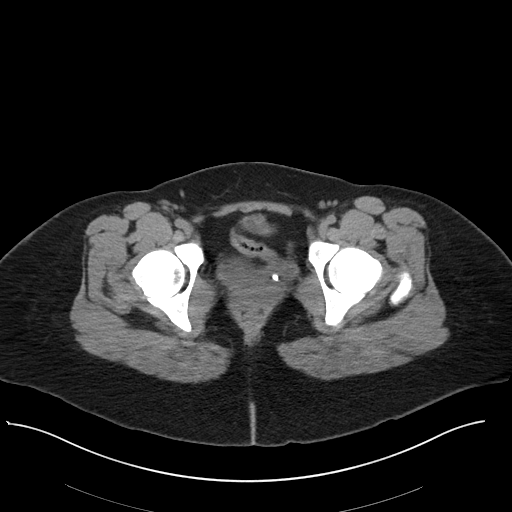
[im 19/92  soft-tissue]
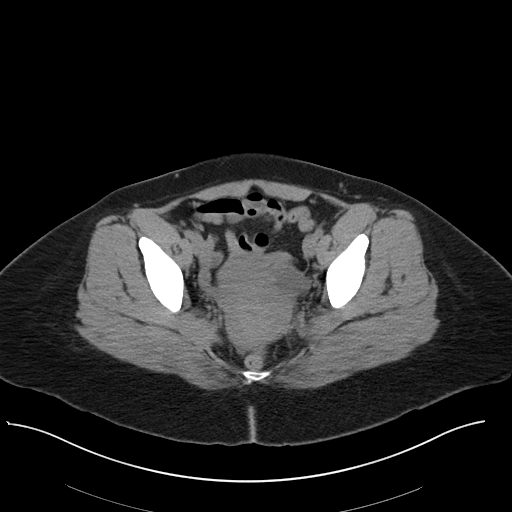
[im 27/92  soft-tissue]
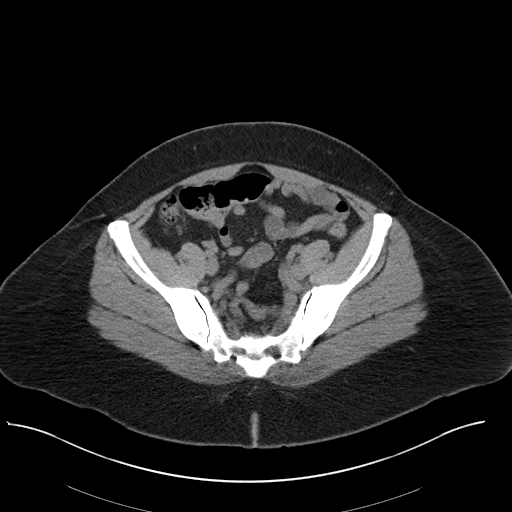
[im 31/92  soft-tissue]
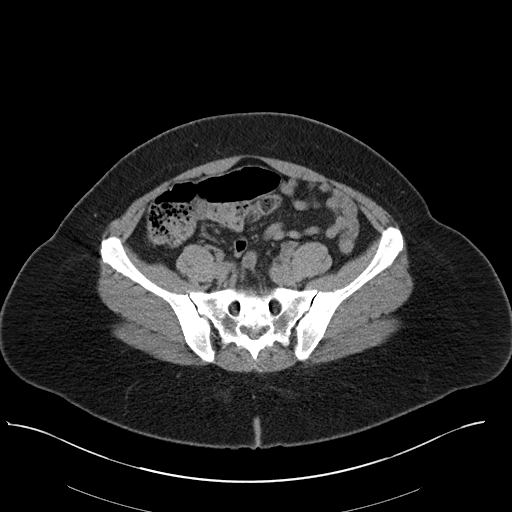
[im 38/92  soft-tissue]
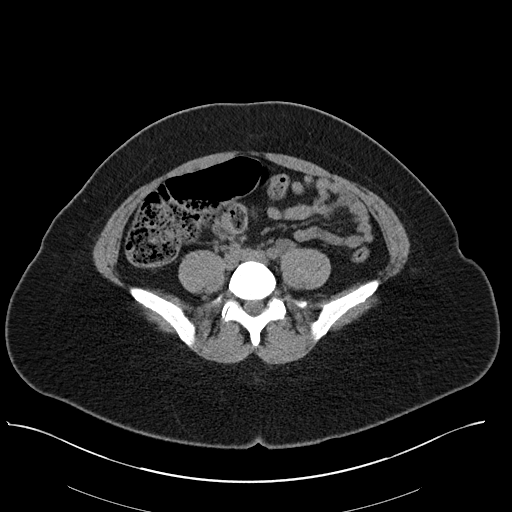
[im 46/92  soft-tissue]
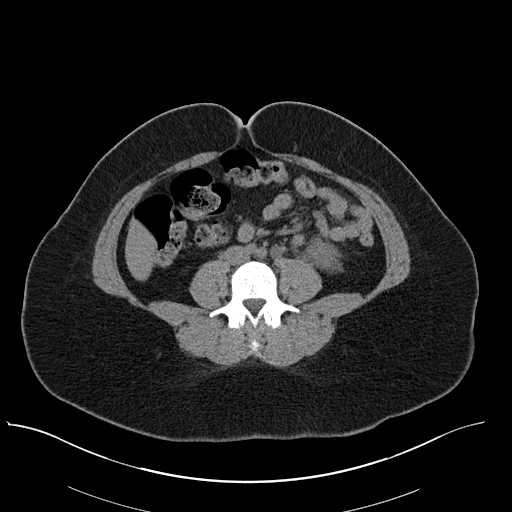
[im 54/92  soft-tissue]
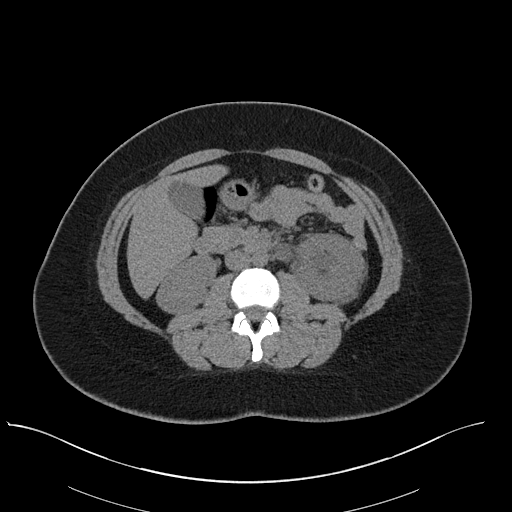
[im 61/92  soft-tissue]
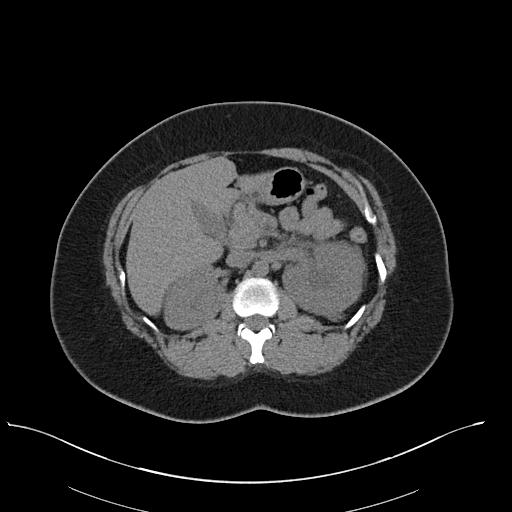
[im 61/92  bone]
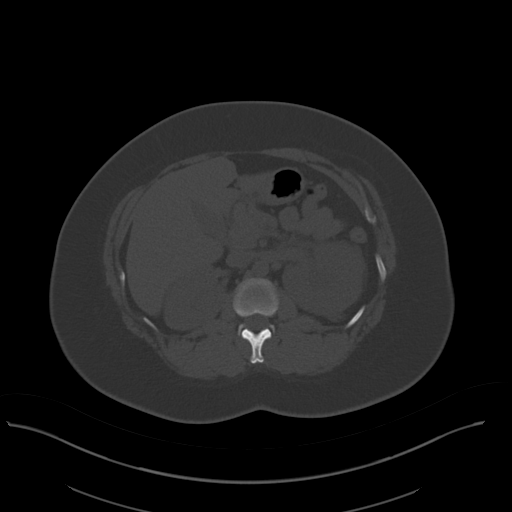
[im 65/92  soft-tissue]
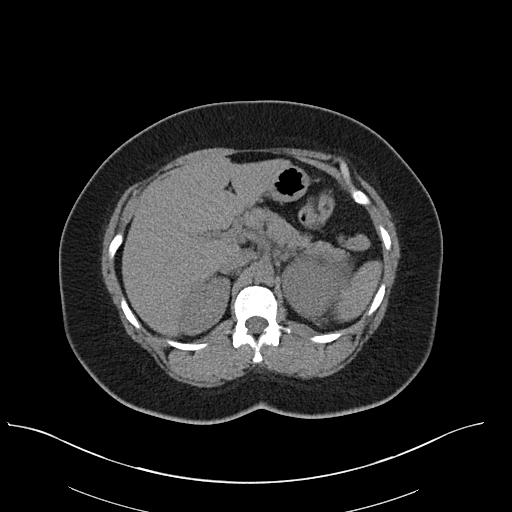
[im 73/92  soft-tissue]
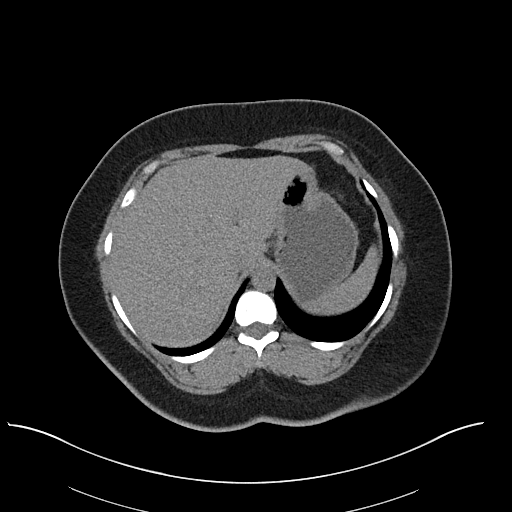
[im 80/92  soft-tissue]
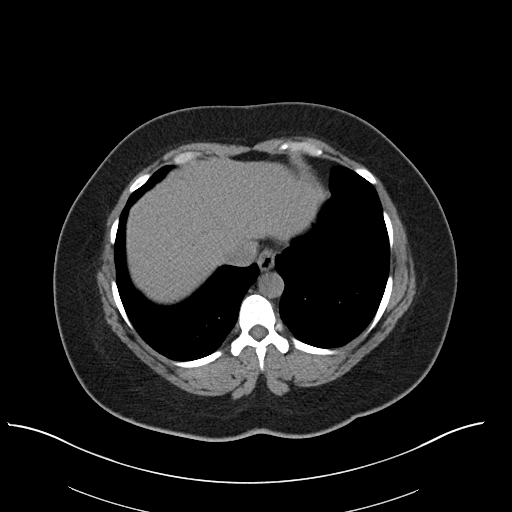
[im 88/92  soft-tissue]
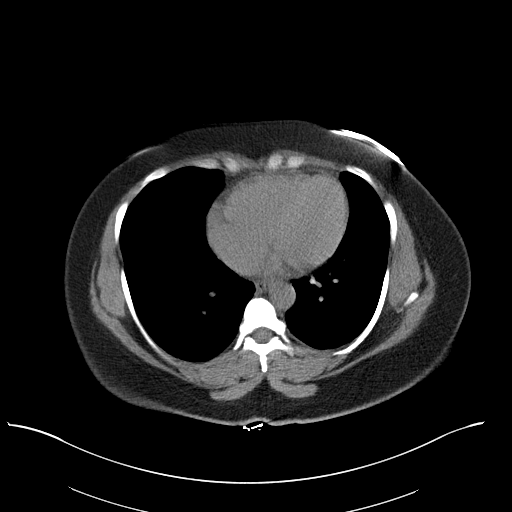

[Series 5: coronal soft tissue · coronal · 0.84mm/px · 3 of 101 slices shown]
[im 34/101  soft-tissue]
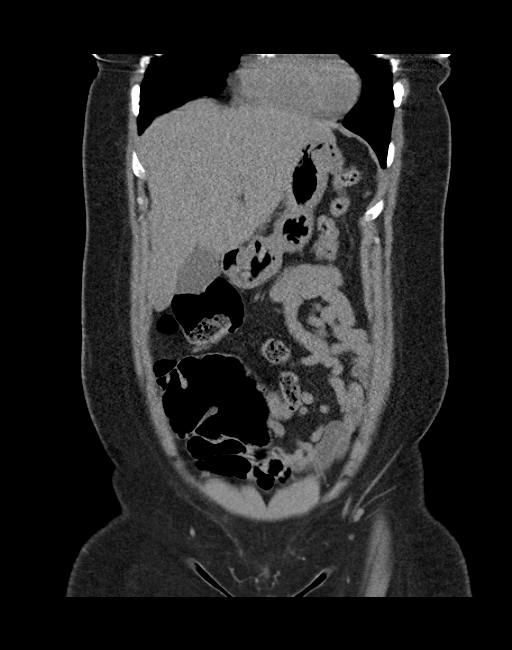
[im 45/101  soft-tissue]
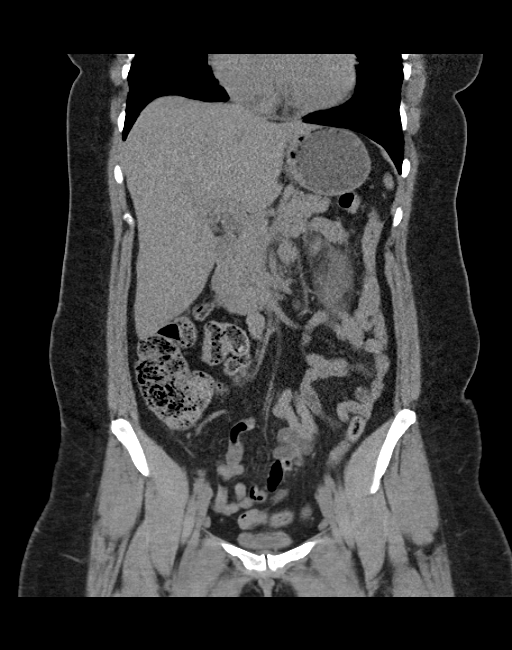
[im 56/101  soft-tissue]
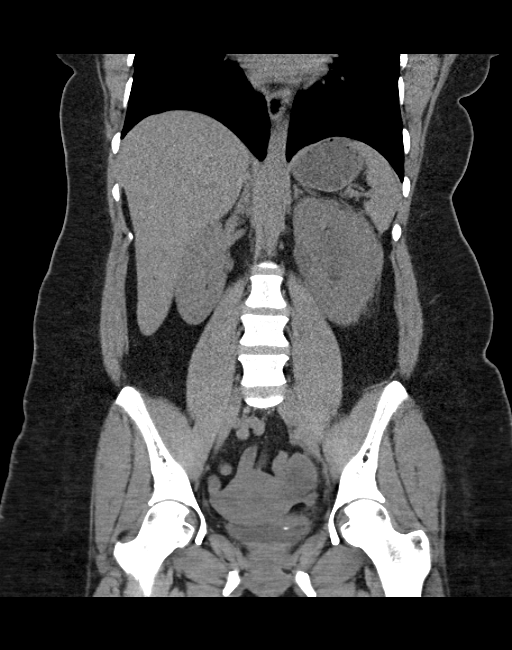

[16 of 46 positions shown; findings below may reference images not displayed]

FINDINGS: BODY WALL: Unremarkable.

LOWER CHEST:

Mediastinum: Unremarkable.

Lungs/pleura: No consolidation.

ABDOMEN/PELVIS:

Liver: No focal abnormality.

Biliary: No evidence of biliary obstruction or stone.

Pancreas: Unremarkable.

Spleen: Unremarkable.

Adrenals: Unremarkable.

Kidneys and ureters: Left hydroureteronephrosis with left renomegaly
and extensive left perinephric edema- secondary to a 6 mm left
ureteral vesicular stone. No intrarenal calculi seen.

Bladder: Unremarkable.

Bowel: No obstruction. Normal appendix.

Retroperitoneum: No mass or adenopathy.

Peritoneum: No free fluid or gas.

Reproductive: Dominant follicle in the left ovary, 2.3 cm.

Vascular: No acute abnormality.

OSSEOUS: No acute abnormalities. No suspicious lytic or blastic
lesions.
IMPRESSION: 1. 6 mm left ureteral vesicular junction calculus with obstructive
uropathy.
2. No intrarenal calculi.

## 2015-03-30 ENCOUNTER — Inpatient Hospital Stay (HOSPITAL_COMMUNITY)
Admission: AD | Admit: 2015-03-30 | Discharge: 2015-03-30 | Disposition: A | Discharge status: Home / Self Care | Payer: Commercial Managed Care - PPO | Source: Ambulatory Visit | Attending: Obstetrics | Admitting: Obstetrics

## 2015-03-30 ENCOUNTER — Encounter (HOSPITAL_COMMUNITY): Payer: Self-pay | Admitting: *Deleted

## 2015-03-30 ENCOUNTER — Inpatient Hospital Stay (HOSPITAL_COMMUNITY): Payer: Commercial Managed Care - PPO

## 2015-03-30 DIAGNOSIS — O9989 Other specified diseases and conditions complicating pregnancy, childbirth and the puerperium: Secondary | ICD-10-CM | POA: Diagnosis not present

## 2015-03-30 DIAGNOSIS — O26899 Other specified pregnancy related conditions, unspecified trimester: Secondary | ICD-10-CM

## 2015-03-30 DIAGNOSIS — Z3A01 Less than 8 weeks gestation of pregnancy: Secondary | ICD-10-CM | POA: Diagnosis not present

## 2015-03-30 DIAGNOSIS — O209 Hemorrhage in early pregnancy, unspecified: Secondary | ICD-10-CM | POA: Diagnosis present

## 2015-03-30 DIAGNOSIS — R109 Unspecified abdominal pain: Secondary | ICD-10-CM

## 2015-03-30 DIAGNOSIS — B9689 Other specified bacterial agents as the cause of diseases classified elsewhere: Secondary | ICD-10-CM | POA: Diagnosis not present

## 2015-03-30 DIAGNOSIS — N760 Acute vaginitis: Secondary | ICD-10-CM | POA: Insufficient documentation

## 2015-03-30 DIAGNOSIS — O23591 Infection of other part of genital tract in pregnancy, first trimester: Secondary | ICD-10-CM | POA: Insufficient documentation

## 2015-03-30 DIAGNOSIS — Z880 Allergy status to penicillin: Secondary | ICD-10-CM | POA: Diagnosis not present

## 2015-03-30 LAB — URINALYSIS, ROUTINE W REFLEX MICROSCOPIC
Bilirubin Urine: NEGATIVE
Glucose, UA: NEGATIVE mg/dL
Ketones, ur: NEGATIVE mg/dL
Nitrite: NEGATIVE
PH: 6 (ref 5.0–8.0)
Protein, ur: NEGATIVE mg/dL
SPECIFIC GRAVITY, URINE: 1.02 (ref 1.005–1.030)

## 2015-03-30 LAB — ABO/RH: ABO/RH(D): O POS

## 2015-03-30 LAB — URINE MICROSCOPIC-ADD ON

## 2015-03-30 LAB — WET PREP, GENITAL
Sperm: NONE SEEN
Trich, Wet Prep: NONE SEEN
YEAST WET PREP: NONE SEEN

## 2015-03-30 LAB — CBC
HEMATOCRIT: 34.7 % — AB (ref 36.0–46.0)
HEMOGLOBIN: 11.5 g/dL — AB (ref 12.0–15.0)
MCH: 29.6 pg (ref 26.0–34.0)
MCHC: 33.1 g/dL (ref 30.0–36.0)
MCV: 89.2 fL (ref 78.0–100.0)
Platelets: 286 10*3/uL (ref 150–400)
RBC: 3.89 MIL/uL (ref 3.87–5.11)
RDW: 12.9 % (ref 11.5–15.5)
WBC: 6.2 10*3/uL (ref 4.0–10.5)

## 2015-03-30 LAB — HCG, QUANTITATIVE, PREGNANCY: HCG, BETA CHAIN, QUANT, S: 1800 m[IU]/mL — AB (ref <5)

## 2015-03-30 LAB — POCT PREGNANCY, URINE: PREG TEST UR: POSITIVE — AB

## 2015-03-30 MED ORDER — METRONIDAZOLE 500 MG PO TABS: 500.0000 mg | ORAL_TABLET | Freq: Two times a day (BID) | ORAL | Status: DC

## 2015-03-30 NOTE — MAU Provider Note (Signed)
History     CSN: [id number]  Arrival date and time: 03/30/15 1041   First Provider Initiated Contact with Patient 03/30/15 1120      Chief Complaint  Patient presents with  . Possible Pregnancy  . Vaginal Bleeding  . Abdominal Pain   HPI   Ms.Kirsten Thomas is a 39 y.o. female [phone] at [redacted]w[redacted]d presenting to MAU with vagianl bleeding and abdominal pain. The bleeding started yesterday. The bleeding is scant amount; bright red blood from the vagina. She has also been experiencing  Abdominal pain 2/10; which is described as cramping pain in both sides of her lower abdomen worse on the Left side. She has not taken anything for pain.   OB History    Gravida Para Term Preterm AB TAB SAB Ectopic Multiple Living   Past Medical History  Diagnosis Date  . PMDD (premenstrual dysphoric disorder)   . Anxiety   . GERD (gastroesophageal reflux disease)     occasional with food  . Headache(784.0)     migraines    Past Surgical History  Procedure Laterality Date  . No past surgeries    . Cystoscopy with retrograde pyelogram, ureteroscopy and stent placement Left 03/07/2013    Procedure: CYSTOSCOPY WITH RETROGRADE PYELOGRAM, URETEROSCOPY, STONE REMOVAL ;  Surgeon: Crecencio Mc, MD;  Location: [facility];  Service: Urology;  Laterality: Left;  . Holmium laser application Left 03/07/2013    Procedure:  HOLMIUM LASER APPLICATION;  Surgeon: Crecencio Mc, MD;  Location: [facility];  Service: Urology;  Laterality: Left;    History reviewed. No pertinent family history.  Social History  Substance Use Topics  . Smoking status: Never Smoker   . Smokeless tobacco: Never Used  . Alcohol Use: 0.0 oz/week    0 Standard drinks or equivalent per week     Comment: socially    Allergies:  Allergies  Allergen Reactions  . Penicillins Hives    Prescriptions prior to admission  Medication Sig Dispense Refill Last Dose  . clindamycin-benzoyl peroxide (BENZACLIN) gel Apply 1  application topically daily.   Taking  . FLUoxetine (PROZAC) 10 MG capsule Take 20 mg by mouth every morning.    Taking  . L-Methylfolate (DEPLIN) 15 MG TABS Take by mouth.   Taking   Results for orders placed or performed during the hospital encounter of 03/30/15 (from the past 24 hour(s))  Urinalysis, Routine w reflex microscopic (not at [facility])     Status: Abnormal   Collection Time: 03/30/15 11:00 AM  Result Value Ref Range   Color, Urine YELLOW YELLOW   APPearance CLEAR CLEAR   Specific Gravity, Urine 1.020 1.005 - 1.030   pH 6.0 5.0 - 8.0   Glucose, UA NEGATIVE NEGATIVE mg/dL   Hgb urine dipstick LARGE (A) NEGATIVE   Bilirubin Urine NEGATIVE NEGATIVE   Ketones, ur NEGATIVE NEGATIVE mg/dL   Protein, ur NEGATIVE NEGATIVE mg/dL   Nitrite NEGATIVE NEGATIVE   Leukocytes, UA SMALL (A) NEGATIVE  Urine microscopic-add on     Status: Abnormal   Collection Time: 03/30/15 11:00 AM  Result Value Ref Range   Squamous Epithelial / LPF 0-5 (A) NONE SEEN   WBC, UA 0-5 0 - 5 WBC/hpf   RBC / HPF 0-5 0 - 5 RBC/hpf   Bacteria, UA RARE (A) NONE SEEN   Urine-Other MUCOUS PRESENT   Pregnancy, urine POC     Status: Abnormal  Collection Time: 03/30/15 11:03 AM  Result Value Ref Range   Preg Test, Ur POSITIVE (A) NEGATIVE  CBC     Status: Abnormal   Collection Time: 03/30/15 11:42 AM  Result Value Ref Range   WBC 6.2 4.0 - 10.5 K/uL   RBC 3.89 3.87 - 5.11 MIL/uL   Hemoglobin 11.5 (L) 12.0 - 15.0 g/dL   HCT 96.2 (L) 95.2 - 84.1 %   MCV 89.2 78.0 - 100.0 fL   MCH 29.6 26.0 - 34.0 pg   MCHC 33.1 30.0 - 36.0 g/dL   RDW 32.4 40.1 - 02.7 %   Platelets 286 150 - 400 K/uL  ABO/Rh     Status: None   Collection Time: 03/30/15 11:42 AM  Result Value Ref Range   ABO/RH(D) O POS   hCG, quantitative, pregnancy     Status: Abnormal   Collection Time: 03/30/15 11:42 AM  Result Value Ref Range   hCG, Beta Chain, Quant, S 1800 (H) <5 mIU/mL  Wet prep, genital     Status: Abnormal   Collection Time:  03/30/15 12:05 PM  Result Value Ref Range   Yeast Wet Prep HPF POC NONE SEEN NONE SEEN   Trich, Wet Prep NONE SEEN NONE SEEN   Clue Cells Wet Prep HPF POC PRESENT (A) NONE SEEN   WBC, Wet Prep HPF POC MANY (A) NONE SEEN   Sperm NONE SEEN    US Ob Comp Less 14 Wks  03/30/2015  CLINICAL DATA:  Pain in spotting for 1 day. EXAM: OBSTETRIC <14 WK [place] AND TRANSVAGINAL OB US TECHNIQUE: Both transabdominal and transvaginal ultrasound examinations were performed for complete evaluation of the gestation as well as the maternal uterus, adnexal regions, and pelvic cul-de-sac. Transvaginal technique was performed to assess early pregnancy. COMPARISON:  None. FINDINGS: Intrauterine gestational sac: Single Yolk sac:  Yes Embryo:  No Cardiac Activity: No Heart Rate: Not applicable  bpm MSD: 7.0  mm   5 w   3  d Maternal uterus/adnexae: Subchorionic hemorrhage: None Right ovary: Normal Left ovary: Normal Other :None Free fluid:  Trace free fluid identified within the pelvis IMPRESSION: 1. Probable early intrauterine gestational sac with yolk sac, but no fetal pole, or cardiac activity yet visualized. Recommend follow-up quantitative B-HCG levels and follow-up US in 14 days to confirm and assess viability. This recommendation follows SRU consensus guidelines: Diagnostic Criteria for Nonviable Pregnancy Early in the First Trimester. Malva Limes Med 2013; 253:6644-03. 2. Trace free fluid noted. Electronically Signed   By: Signa Kell M.D.   On: 03/30/2015 13:12   US Ob Transvaginal  03/30/2015  CLINICAL DATA:  Pain in spotting for 1 day. EXAM: OBSTETRIC <14 WK [place] AND TRANSVAGINAL OB US TECHNIQUE: Both transabdominal and transvaginal ultrasound examinations were performed for complete evaluation of the gestation as well as the maternal uterus, adnexal regions, and pelvic cul-de-sac. Transvaginal technique was performed to assess early pregnancy. COMPARISON:  None. FINDINGS: Intrauterine gestational sac: Single Yolk sac:   Yes Embryo:  No Cardiac Activity: No Heart Rate: Not applicable  bpm MSD: 7.0  mm   5 w   3  d Maternal uterus/adnexae: Subchorionic hemorrhage: None Right ovary: Normal Left ovary: Normal Other :None Free fluid:  Trace free fluid identified within the pelvis IMPRESSION: 1. Probable early intrauterine gestational sac with yolk sac, but no fetal pole, or cardiac activity yet visualized. Recommend follow-up quantitative B-HCG levels and follow-up US in 14 days to confirm and assess viability. This recommendation  follows SRU consensus guidelines: Diagnostic Criteria for Nonviable Pregnancy Early in the First Trimester. Malva Limes Engl J Med 2013; 161:0960-45; 369:1443-51. 2. Trace free fluid noted. Electronically Signed   By: Signa Kellaylor  Stroud M.D.   On: 03/30/2015 13:12    Review of Systems  Constitutional: Negative for fever and chills.  Gastrointestinal: Negative for nausea and vomiting.   Physical Exam   Blood pressure 140/67, pulse 86, temperature 98.1 F (36.7 C), temperature source Oral, resp. rate 18, height 5\' 4"  (1.626 m), weight 212 lb (96.163 kg), last menstrual period 01/25/2015.  Physical Exam  Constitutional: She is oriented to person, place, and time. She appears well-developed and well-nourished. No distress.  HENT:  Head: Normocephalic.  Respiratory: Effort normal.  Genitourinary:  Wet prep and GC collected without speculum. No blood noted on swabs.   Musculoskeletal: Normal range of motion.  Neurological: She is alert and oriented to person, place, and time.  Skin: Skin is warm. She is not diaphoretic.    MAU Course  Procedures  None  MDM O positive blood type   Assessment and Plan   A:  1. Abdominal pain in pregnancy   2. Vaginal bleeding in pregnancy, first trimester   3. Bacterial vaginosis     P:  SIUP with yolk sac.  Discharge home in stable condition RX: Flagyl  Start prenatal care ASAP Return to MAU as needed, for pain or bleeding.  Pelvic rest.     Duane LopeJennifer I Suzana Sohail,  NP 03/30/2015 12:53 PM

## 2015-03-30 NOTE — MAU Note (Signed)
+  HPT last night. Noted some bleeding when used restroom this morning, having some cramping.  Called office to make appt, was instructed to come here.

## 2015-03-30 NOTE — Discharge Instructions (Signed)

## 2015-03-31 LAB — HIV ANTIBODY (ROUTINE TESTING W REFLEX): HIV SCREEN 4TH GENERATION: NONREACTIVE

## 2015-04-01 ENCOUNTER — Ambulatory Visit (INDEPENDENT_AMBULATORY_CARE_PROVIDER_SITE_OTHER): Payer: Commercial Managed Care - PPO | Admitting: Obstetrics

## 2015-04-01 ENCOUNTER — Encounter: Payer: Self-pay | Admitting: *Deleted

## 2015-04-01 VITALS — BP 100/68 | HR 80

## 2015-04-01 DIAGNOSIS — O200 Threatened abortion: Secondary | ICD-10-CM | POA: Diagnosis not present

## 2015-04-01 DIAGNOSIS — O3680X1 Pregnancy with inconclusive fetal viability, fetus 1: Secondary | ICD-10-CM

## 2015-04-01 LAB — GC/CHLAMYDIA PROBE AMP (~~LOC~~) NOT AT ARMC
Chlamydia: NEGATIVE
Neisseria Gonorrhea: NEGATIVE

## 2015-04-01 NOTE — Progress Notes (Signed)
Pt is having some light bleeding and cramping in early pregnancy since Monday.

## 2015-04-02 LAB — HCG, QUANTITATIVE, PREGNANCY: hCG, Beta Chain, Quant, S: 1761 m[IU]/mL — ABNORMAL HIGH

## 2015-04-03 ENCOUNTER — Encounter: Payer: Self-pay | Admitting: Obstetrics

## 2015-04-03 NOTE — Progress Notes (Signed)
Subjective:    Kirsten Thomas is a 39 y.o. female. Kirsten Thomas reports bleeding and cramping since Monday. She is not in acute distress. Ectopic risks: none.  Cycle length: regular 28 day. Pregnancy testing: positive. Pregnancy imaging: transvaginal ultrasound done on 03-30-15. Result 6 week IUP. Blood type: O positive Other lab results: Hgb = 11.5, and STD screen negative  Past Medical History  Diagnosis Date  . PMDD (premenstrual dysphoric disorder)   . Anxiety   . GERD (gastroesophageal reflux disease)     occasional with food  . Headache(784.0)     migraines    Past Surgical History  Procedure Laterality Date  . No past surgeries    . Cystoscopy with retrograde pyelogram, ureteroscopy and stent placement Left 03/07/2013    Procedure: CYSTOSCOPY WITH RETROGRADE PYELOGRAM, URETEROSCOPY, STONE REMOVAL ;  Surgeon: Crecencio McLes Borden, MD;  Location: [facility];  Service: Urology;  Laterality: Left;  . Holmium laser application Left 03/07/2013    Procedure:  HOLMIUM LASER APPLICATION;  Surgeon: Crecencio McLes Borden, MD;  Location: [facility];  Service: Urology;  Laterality: Left;    Current Outpatient Prescriptions  Medication Sig Dispense Refill  . FLUoxetine (PROZAC) 20 MG tablet Take 20 mg by mouth daily. Takes a 20 mg and 10 mg daily for total of 30 mg    . Dapsone (ACZONE) 5 % topical gel Apply 1 application topically daily.    . metroNIDAZOLE (FLAGYL) 500 MG tablet Take 1 tablet (500 mg total) by mouth 2 (two) times daily. (Patient not taking: Reported on 04/01/2015) 14 tablet 0   No current facility-administered medications for this visit.   Allergies  Allergen Reactions  . Penicillins Hives    Has patient had a PCN reaction causing immediate rash, facial/tongue/throat swelling, SOB or lightheadedness with hypotension: Yes Has patient had a PCN reaction causing severe rash involving mucus membranes or skin necrosis: No Has patient had a PCN reaction that required hospitalization No Has patient had  a PCN reaction occurring within the last 10 years: No If all of the above answers are "NO", then may proceed with Cephalosporin use.     Social History  Substance Use Topics  . Smoking status: Never Smoker   . Smokeless tobacco: Never Used  . Alcohol Use: 0.0 oz/week    0 Standard drinks or equivalent per week     Comment: socially    History reviewed. No pertinent family history.   Review of Systems Constitutional: negative for fatigue and weight loss Respiratory: negative for cough and wheezing Cardiovascular: negative for chest pain, fatigue and palpitations Gastrointestinal: negative for abdominal pain and change in bowel habits Genitourinary: vaginal bleeding and cramping Integument/breast: negative for nipple discharge Musculoskeletal:negative for myalgias Neurological: negative for gait problems and tremors Behavioral/Psych: negative for abusive relationship, depression Endocrine: negative for temperature intolerance      Objective:     BP 100/68 mmHg  Pulse 80  LMP 01/25/2015 General:   alert  Skin:   no rash or abnormalities  Lungs:   clear to auscultation bilaterally  Heart:   regular rate and rhythm, S1, S2 normal, no murmur, click, rub or gallop  Breasts:   normal without suspicious masses, skin or nipple changes or axillary nodes  Abdomen:  normal findings: no organomegaly, soft, non-tender and no hernia  Pelvis:  External genitalia: normal general appearance Urinary system: urethral meatus normal and bladder without fullness, nontender Vaginal: normal without tenderness, induration or masses Cervix: normal appearance Adnexa: normal bimanual exam Uterus: anteverted  and non-tender, normal size    Imaging Limited office ultrasound: not done       Assessment:      Bleeding in early pregnancy IUP at [redacted] weeks gestation   Plan:    Warning signs discussed: to call for increased bleeding, abdominal or shoulder pain, light headedness, or if she has any  concerns. NOB appointment scheduled.

## 2015-04-07 ENCOUNTER — Other Ambulatory Visit: Payer: Commercial Managed Care - PPO

## 2015-04-07 DIAGNOSIS — O021 Missed abortion: Secondary | ICD-10-CM

## 2015-04-07 DIAGNOSIS — O3680X1 Pregnancy with inconclusive fetal viability, fetus 1: Secondary | ICD-10-CM

## 2015-04-08 ENCOUNTER — Telehealth: Payer: Self-pay

## 2015-04-08 LAB — HCG, QUANTITATIVE, PREGNANCY: HCG, BETA CHAIN, QUANT, S: 2347.1 m[IU]/mL — AB

## 2015-04-08 NOTE — Telephone Encounter (Signed)
NEED ORDER FOR U/S TO BE SCHEDULED IN 2 WEEKS

## 2015-04-09 ENCOUNTER — Other Ambulatory Visit: Payer: Self-pay | Admitting: Obstetrics

## 2015-04-09 DIAGNOSIS — O3680X1 Pregnancy with inconclusive fetal viability, fetus 1: Secondary | ICD-10-CM

## 2015-04-10 ENCOUNTER — Other Ambulatory Visit: Payer: Self-pay | Admitting: Obstetrics

## 2015-04-10 ENCOUNTER — Telehealth: Payer: Self-pay

## 2015-04-10 DIAGNOSIS — O3680X1 Pregnancy with inconclusive fetal viability, fetus 1: Secondary | ICD-10-CM

## 2015-04-10 NOTE — Telephone Encounter (Signed)
U/S ORDER NEEDS TO BE CHANGED TO OB TRANSVAGINAL RATHER THAN FOLLOW-UP PER WH [facility]

## 2015-04-15 ENCOUNTER — Encounter: Payer: Self-pay | Admitting: Obstetrics

## 2015-04-16 ENCOUNTER — Ambulatory Visit (HOSPITAL_COMMUNITY)
Admission: RE | Admit: 2015-04-16 | Discharge: 2015-04-16 | Disposition: A | Discharge status: Home / Self Care | Payer: Commercial Managed Care - PPO | Source: Ambulatory Visit | Attending: Obstetrics | Admitting: Obstetrics

## 2015-04-16 DIAGNOSIS — O3680X1 Pregnancy with inconclusive fetal viability, fetus 1: Secondary | ICD-10-CM

## 2015-04-16 DIAGNOSIS — Z3A01 Less than 8 weeks gestation of pregnancy: Secondary | ICD-10-CM | POA: Insufficient documentation

## 2015-04-16 DIAGNOSIS — Z36 Encounter for antenatal screening of mother: Secondary | ICD-10-CM | POA: Diagnosis not present

## 2015-04-18 ENCOUNTER — Encounter (HOSPITAL_COMMUNITY): Payer: Self-pay | Admitting: Student

## 2015-04-18 ENCOUNTER — Inpatient Hospital Stay (HOSPITAL_COMMUNITY): Payer: Commercial Managed Care - PPO

## 2015-04-18 ENCOUNTER — Inpatient Hospital Stay (HOSPITAL_COMMUNITY)
Admission: AD | Admit: 2015-04-18 | Discharge: 2015-04-18 | Disposition: A | Discharge status: Home / Self Care | Payer: Commercial Managed Care - PPO | Source: Ambulatory Visit | Attending: Obstetrics | Admitting: Obstetrics

## 2015-04-18 DIAGNOSIS — Z880 Allergy status to penicillin: Secondary | ICD-10-CM | POA: Diagnosis not present

## 2015-04-18 DIAGNOSIS — O4691 Antepartum hemorrhage, unspecified, first trimester: Secondary | ICD-10-CM | POA: Diagnosis not present

## 2015-04-18 DIAGNOSIS — O209 Hemorrhage in early pregnancy, unspecified: Secondary | ICD-10-CM | POA: Diagnosis not present

## 2015-04-18 DIAGNOSIS — Z3A01 Less than 8 weeks gestation of pregnancy: Secondary | ICD-10-CM | POA: Diagnosis not present

## 2015-04-18 LAB — CBC
HCT: 35.5 % — ABNORMAL LOW (ref 36.0–46.0)
HEMOGLOBIN: 12 g/dL (ref 12.0–15.0)
MCH: 30.1 pg (ref 26.0–34.0)
MCHC: 33.8 g/dL (ref 30.0–36.0)
MCV: 89 fL (ref 78.0–100.0)
Platelets: 333 10*3/uL (ref 150–400)
RBC: 3.99 MIL/uL (ref 3.87–5.11)
RDW: 12.4 % (ref 11.5–15.5)
WBC: 7.2 10*3/uL (ref 4.0–10.5)

## 2015-04-18 LAB — URINE MICROSCOPIC-ADD ON

## 2015-04-18 LAB — URINALYSIS, ROUTINE W REFLEX MICROSCOPIC
BILIRUBIN URINE: NEGATIVE
GLUCOSE, UA: NEGATIVE mg/dL
Ketones, ur: NEGATIVE mg/dL
NITRITE: NEGATIVE
PH: 5.5 (ref 5.0–8.0)
Protein, ur: 30 mg/dL — AB
SPECIFIC GRAVITY, URINE: 1.025 (ref 1.005–1.030)

## 2015-04-18 NOTE — MAU Provider Note (Signed)
History     CSN: [id number]  Arrival date and time: 04/18/15 1840   First Provider Initiated Contact with Patient 04/18/15 1909       Chief Complaint  Patient presents with  . Vaginal Bleeding   HPI Kirsten Thomas is a 39 y.o. [phone] at [id number] who presents for vaginal bleeding.  Reports vaginal spotting this week that increased to heavy red bleeding with several small clots.  Reports mild abdominal cramping.  Denies n/v/d, constipation, dysuria, or fever.  No recent intercourse. Had transvaginal ultrasound 2 days ago.    OB History    Gravida Para Term Preterm AB TAB SAB Ectopic Multiple Living   4 3 3       3       Past Medical History  Diagnosis Date  . PMDD (premenstrual dysphoric disorder)   . Anxiety   . GERD (gastroesophageal reflux disease)     occasional with food  . Headache(784.0)     migraines    Past Surgical History  Procedure Laterality Date  . No past surgeries    . Cystoscopy with retrograde pyelogram, ureteroscopy and stent placement Left 03/07/2013    Procedure: CYSTOSCOPY WITH RETROGRADE PYELOGRAM, URETEROSCOPY, STONE REMOVAL ;  Surgeon: Crecencio McLes Borden, MD;  Location: [facility];  Service: Urology;  Laterality: Left;  . Holmium laser application Left 03/07/2013    Procedure:  HOLMIUM LASER APPLICATION;  Surgeon: Crecencio McLes Borden, MD;  Location: [facility];  Service: Urology;  Laterality: Left;    History reviewed. No pertinent family history.  Social History  Substance Use Topics  . Smoking status: Never Smoker   . Smokeless tobacco: Never Used  . Alcohol Use: 0.0 oz/week    0 Standard drinks or equivalent per week     Comment: socially    Allergies:  Allergies  Allergen Reactions  . Penicillins Hives    Has patient had a PCN reaction causing immediate rash, facial/tongue/throat swelling, SOB or lightheadedness with hypotension: Yes Has patient had a PCN reaction causing severe rash involving mucus membranes or skin necrosis: No Has patient had a PCN  reaction that required hospitalization No Has patient had a PCN reaction occurring within the last 10 years: No If all of the above answers are "NO", then may proceed with Cephalosporin use.     Prescriptions prior to admission  Medication Sig Dispense Refill Last Dose  . Dapsone (ACZONE) 5 % topical gel Apply 1 application topically daily.   03/30/2015 at Unknown time  . FLUoxetine (PROZAC) 20 MG tablet Take 20 mg by mouth daily. Takes a 20 mg and 10 mg daily for total of 30 mg   Taking  . metroNIDAZOLE (FLAGYL) 500 MG tablet Take 1 tablet (500 mg total) by mouth 2 (two) times daily. (Patient not taking: Reported on 04/01/2015) 14 tablet 0 Not Taking    Review of Systems  Constitutional: Negative.   Gastrointestinal: Positive for abdominal pain. Negative for nausea, vomiting, diarrhea and constipation.  Genitourinary: Negative for dysuria.       + vaginal bleeding   Physical Exam   Blood pressure 111/66, pulse 89, temperature 97.8 F (36.6 C), temperature source Oral, resp. rate 18, last menstrual period 01/25/2015.  Physical Exam  Nursing note and vitals reviewed. Constitutional: She is oriented to person, place, and time. She appears well-developed and well-nourished. No distress.  HENT:  Head: Normocephalic and atraumatic.  Eyes: Conjunctivae are normal. Right eye exhibits no discharge. Left eye exhibits no discharge. No scleral icterus.  Neck: Normal range of motion.  Cardiovascular: Normal rate, regular rhythm and normal heart sounds.   No murmur heard. Respiratory: Effort normal and breath sounds normal. No respiratory distress. She has no wheezes.  GI: Soft. Bowel sounds are normal. There is no tenderness.  Genitourinary: Vagina normal. Cervix exhibits no motion tenderness and no friability.  Cervix closed Small amount of dark red blood cleared out with 1 fox swab.   Neurological: She is alert and oriented to person, place, and time.  Skin: Skin is warm and dry. She is  not diaphoretic.  Psychiatric: She has a normal mood and affect. Her behavior is normal. Judgment and thought content normal.    MAU Course  Procedures Results for orders placed or performed during the hospital encounter of 04/18/15 (from the past 24 hour(s))  Urinalysis, Routine w reflex microscopic (not at [facility])     Status: Abnormal   Collection Time: 04/18/15  6:50 PM  Result Value Ref Range   Color, Urine ORANGE (A) YELLOW   APPearance CLEAR CLEAR   Specific Gravity, Urine 1.025 1.005 - 1.030   pH 5.5 5.0 - 8.0   Glucose, UA NEGATIVE NEGATIVE mg/dL   Hgb urine dipstick LARGE (A) NEGATIVE   Bilirubin Urine NEGATIVE NEGATIVE   Ketones, ur NEGATIVE NEGATIVE mg/dL   Protein, ur 30 (A) NEGATIVE mg/dL   Nitrite NEGATIVE NEGATIVE   Leukocytes, UA SMALL (A) NEGATIVE  Urine microscopic-add on     Status: Abnormal   Collection Time: 04/18/15  6:50 PM  Result Value Ref Range   Squamous Epithelial / LPF 0-5 (A) NONE SEEN   WBC, UA 0-5 0 - 5 WBC/hpf   RBC / HPF 6-30 0 - 5 RBC/hpf   Bacteria, UA FEW (A) NONE SEEN  CBC     Status: Abnormal   Collection Time: 04/18/15  6:55 PM  Result Value Ref Range   WBC 7.2 4.0 - 10.5 K/uL   RBC 3.99 3.87 - 5.11 MIL/uL   Hemoglobin 12.0 12.0 - 15.0 g/dL   HCT 56.2 (L) 13.0 - 86.5 %   MCV 89.0 78.0 - 100.0 fL   MCH 30.1 26.0 - 34.0 pg   MCHC 33.8 30.0 - 36.0 g/dL   RDW 78.4 69.6 - 29.5 %   Platelets 333 150 - 400 K/uL   US Ob Transvaginal  04/18/2015  CLINICAL DATA:  Vaginal bleeding, first trimester of pregnancy. EXAM: OBSTETRIC <14 WK ULTRASOUND TECHNIQUE: Transabdominal ultrasound was performed for evaluation of the gestation as well as the maternal uterus and adnexal regions. COMPARISON:  Ultrasound of April 16, 2015. FINDINGS: Intrauterine gestational sac: Visualized/normal in shape. Yolk sac:  Visualized. Embryo:  Visualized. Cardiac Activity: Visualized. Heart Rate: 159 bpm CRL:   16  mm   7 w 6 d                  [facility]: November 29, 2015.  Maternal uterus/adnexae: Ovaries appear normal. No free fluid is noted. IMPRESSION: Single live intrauterine gestation of 7 weeks 6 days. Electronically Signed   By: Lupita Raider, M.D.   On: 04/18/2015 20:30    MDM O positive Subchorionic hemorrhage on 12/8, not seen tonight. Likely cause of her vaginal bleeding.  Cervix closed.  Assessment and Plan  A:  1. Vaginal bleeding in pregnancy, first trimester    P: Discharge home Keep f/u with [facility] Discussed reasons to return to MAU Pelvic rest until bleeding stops.   Judeth Horn, NP  04/18/2015,  7:08 PM

## 2015-04-18 NOTE — Discharge Instructions (Signed)
Your ultrasound showed a viable pregnancy with a fetal heart beat. The collection of blood seen on the last ultrasound appears to have resolved.  You should continue normal prenatal care.   Return to care   If you have heavier bleeding that soaks through more that 2 pads per hour for an hour of more  If you bleed so much that you feel like your might pass out  If you have significant abdominal pain that is not improved with Tylenol   If you develop a fever > 100.5       Vaginal Bleeding During Pregnancy, First Trimester A small amount of bleeding (spotting) from the vagina is common in early pregnancy. Sometimes the bleeding is normal and is not a problem, and sometimes it is a sign of something serious. Be sure to tell your doctor about any bleeding from your vagina right away. HOME CARE  Watch your condition for any changes.  Follow your doctor's instructions about how active you can be.  If you are on bed rest:  You may need to stay in bed and only get up to use the bathroom.  You may be allowed to do some activities.  If you need help, make plans for someone to help you.  Write down:  The number of pads you use each day.  How often you change pads.  How soaked (saturated) your pads are.  Do not use tampons.  Do not douche.  Do not have sex or orgasms until your doctor says it is okay.  If you pass any tissue from your vagina, save the tissue so you can show it to your doctor.  Only take medicines as told by your doctor.  Do not take aspirin because it can make you bleed.  Keep all follow-up visits as told by your doctor. GET HELP IF:   You bleed from your vagina.  You have cramps.  You have labor pains.  You have a fever that does not go away after you take medicine. GET HELP RIGHT AWAY IF:   You have very bad cramps in your back or belly (abdomen).  You pass large clots or tissue from your vagina.  You bleed more.  You feel light-headed or  weak.  You pass out (faint).  You have chills.  You are leaking fluid or have a gush of fluid from your vagina.  You pass out while pooping (having a bowel movement). MAKE SURE YOU:  Understand these instructions.  Will watch your condition.  Will get help right away if you are not doing well or get worse.   This information is not intended to replace advice given to you by your health care provider. Make sure you discuss any questions you have with your health care provider.   Document Released: 09/09/2013 Document Reviewed: 09/09/2013 Elsevier Interactive Patient Education 2016 Else[employer] Inc[employer]   Subchorionic Hematoma A subchorionic hematoma is a gathering of blood between the outer wall of the placenta and the inner wall of the womb (uterus). The placenta is the organ that connects the fetus to the wall of the uterus. The placenta performs the feeding, breathing (oxygen to the fetus), and waste removal (excretory work) of the fetus.  Subchorionic hematoma is the most common abnormality found on a result from ultrasonography done during the first trimester or early second trimester of pregnancy. If there has been little or no vaginal bleeding, early small hematomas usually shrink on their own and do not affect your baby  or pregnancy. The blood is gradually absorbed over 1-2 weeks. When bleeding starts later in pregnancy or the hematoma is larger or occurs in an older pregnant woman, the outcome may not be as good. Larger hematomas may get bigger, which increases the chances for miscarriage. Subchorionic hematoma also increases the risk of premature detachment of the placenta from the uterus, preterm (premature) labor, and stillbirth. HOME CARE INSTRUCTIONS  Stay on bed rest if your health care provider recommends this. Although bed rest will not prevent more bleeding or prevent a miscarriage, your health care provider may recommend bed rest until you are advised otherwise.  Avoid heavy  lifting (more than 10 lb [4.5 kg]), exercise, sexual intercourse, or douching as directed by your health care provider.  Keep track of the number of pads you use each day and how soaked (saturated) they are. Write down this information.  Do not use tampons.  Keep all follow-up appointments as directed by your health care provider. Your health care provider may ask you to have follow-up blood tests or ultrasound tests or both. SEEK IMMEDIATE MEDICAL CARE IF:  You have severe cramps in your stomach, back, abdomen, or pelvis.  You have a fever.  You pass large clots or tissue. Save any tissue for your health care provider to look at.  Your bleeding increases or you become lightheaded, feel weak, or have fainting episodes.   This information is not intended to replace advice given to you by your health care provider. Make sure you discuss any questions you have with your health care provider.   Document Released: 08/10/2006 Document Revised: 05/16/2014 Document Reviewed: 11/22/2012 Elsevier Interactive Patient Education [employer]

## 2015-04-18 NOTE — MAU Note (Signed)
Pt started having some spotting, woke up from a nap today and had a panty liner that was full of blood and some small clots when she went to the restroom.  Has been filling up a panty liner since 3pm (3-4 panty liners since 3) with some small clotting.  Light cramping 2/10.

## 2015-04-29 ENCOUNTER — Ambulatory Visit (INDEPENDENT_AMBULATORY_CARE_PROVIDER_SITE_OTHER): Payer: Commercial Managed Care - PPO | Admitting: Obstetrics

## 2015-04-29 ENCOUNTER — Encounter: Payer: Self-pay | Admitting: Obstetrics

## 2015-04-29 VITALS — BP 104/75 | HR 68 | Wt 213.0 lb

## 2015-04-29 DIAGNOSIS — Z3169 Encounter for other general counseling and advice on procreation: Secondary | ICD-10-CM

## 2015-04-29 DIAGNOSIS — O039 Complete or unspecified spontaneous abortion without complication: Secondary | ICD-10-CM | POA: Diagnosis not present

## 2015-04-29 NOTE — Progress Notes (Signed)
Patient ID: Kirsten Thomas, female   DOB: [birth year], 39 y.o.   MRN: [id number]  Chief Complaint  Patient presents with  . Follow-up    hospital visit last week, had bleeding-possible SAB     Kirsten Thomas is a 39 y.o. female.  Had a SAB, complete, over past week.  Passed clots with cramping, then passed tissue.  Patient took photo of tissue for review. HPI  Past Medical History  Diagnosis Date  . PMDD (premenstrual dysphoric disorder)   . Anxiety   . GERD (gastroesophageal reflux disease)     occasional with food  . Headache(784.0)     migraines    Past Surgical History  Procedure Laterality Date  . No past surgeries    . Cystoscopy with retrograde pyelogram, ureteroscopy and stent placement Left 03/07/2013    Procedure: CYSTOSCOPY WITH RETROGRADE PYELOGRAM, URETEROSCOPY, STONE REMOVAL ;  Surgeon: Crecencio McLes Borden, MD;  Location: [facility];  Service: Urology;  Laterality: Left;  . Holmium laser application Left 03/07/2013    Procedure:  HOLMIUM LASER APPLICATION;  Surgeon: Crecencio McLes Borden, MD;  Location: [facility];  Service: Urology;  Laterality: Left;    History reviewed. No pertinent family history.  Social History Social History  Substance Use Topics  . Smoking status: Never Smoker   . Smokeless tobacco: Never Used  . Alcohol Use: 0.0 oz/week    0 Standard drinks or equivalent per week     Comment: socially    Allergies  Allergen Reactions  . Penicillins Hives    Has patient had a PCN reaction causing immediate rash, facial/tongue/throat swelling, SOB or lightheadedness with hypotension: Yes Has patient had a PCN reaction causing severe rash involving mucus membranes or skin necrosis: No Has patient had a PCN reaction that required hospitalization No Has patient had a PCN reaction occurring within the last 10 years: No If all of the above answers are "NO", then may proceed with Cephalosporin use.     Current Outpatient Prescriptions  Medication Sig Dispense Refill  .  Dapsone (ACZONE) 5 % topical gel Apply 1 application topically daily. Reported on 04/29/2015    . FLUoxetine (PROZAC) 20 MG tablet Take 20 mg by mouth daily. Reported on 04/29/2015    . Prenatal Vit-Fe Fumarate-FA (PRENATAL MULTIVITAMIN) TABS tablet Take 1 tablet by mouth daily at 12 noon. Reported on 04/29/2015     No current facility-administered medications for this visit.    Review of Systems Review of Systems Constitutional: negative for fatigue and weight loss Respiratory: negative for cough and wheezing Cardiovascular: negative for chest pain, fatigue and palpitations Gastrointestinal: negative for abdominal pain and change in bowel habits Genitourinary:positive for H/O SAB Integument/breast: negative for nipple discharge Musculoskeletal:negative for myalgias Neurological: negative for gait problems and tremors Behavioral/Psych: negative for abusive relationship, depression Endocrine: negative for temperature intolerance     Blood pressure 104/75, pulse 68, weight 213 lb (96.616 kg), last menstrual period 01/25/2015.  Physical Exam Physical Exam: Deferred  100% of 10 min visit spent on counseling and coordination of care.   Data Reviewed Photo of POC  Assessment     SAB, complete.  Desires future fertility.     Plan    Preconception counseling done. Continue PNV's and Folic Acid F/U prn  No orders of the defined types were placed in this encounter.   No orders of the defined types were placed in this encounter.

## 2015-06-03 ENCOUNTER — Other Ambulatory Visit: Payer: Self-pay | Admitting: Certified Nurse Midwife

## 2015-07-07 ENCOUNTER — Ambulatory Visit (INDEPENDENT_AMBULATORY_CARE_PROVIDER_SITE_OTHER): Payer: Commercial Managed Care - PPO | Admitting: Obstetrics

## 2015-07-07 ENCOUNTER — Ambulatory Visit: Payer: Commercial Managed Care - PPO | Admitting: Obstetrics

## 2015-07-07 ENCOUNTER — Encounter: Payer: Self-pay | Admitting: Obstetrics

## 2015-07-07 VITALS — BP 110/77 | HR 73 | Wt 214.0 lb

## 2015-07-07 DIAGNOSIS — B3731 Acute candidiasis of vulva and vagina: Secondary | ICD-10-CM

## 2015-07-07 DIAGNOSIS — B373 Candidiasis of vulva and vagina: Secondary | ICD-10-CM | POA: Diagnosis not present

## 2015-07-07 DIAGNOSIS — N750 Cyst of Bartholin's gland: Secondary | ICD-10-CM

## 2015-07-07 MED ORDER — FLUCONAZOLE 150 MG PO TABS: 150.0000 mg | ORAL_TABLET | Freq: Once | ORAL | Status: DC

## 2015-07-07 MED ORDER — CLINDAMYCIN HCL 300 MG PO CAPS: 300.0000 mg | ORAL_CAPSULE | Freq: Three times a day (TID) | ORAL | Status: DC

## 2015-07-07 NOTE — Patient Instructions (Signed)
Bartholin Cyst or Abscess A Bartholin cyst is a fluid-filled sac that forms on a Bartholin gland. Bartholin glands are small glands that are located within the folds of skin (labia) along the sides of the lower opening of the vagina. These glands produce a fluid to moisten the outside of the vagina during sexual intercourse. A Bartholin cyst causes a bulge on the side of the vagina. A cyst that is not large or infected may not cause symptoms or problems. However, if the fluid within the cyst becomes infected, the cyst can turn into an abscess. An abscess may cause discomfort or pain. CAUSES A Bartholin cyst may develop when the duct of the gland becomes blocked. In many cases, the cause of this is not known. Various kinds of bacteria can cause the cyst to become infected and develop into an abscess. RISK FACTORS You may be at an increased risk of developing a Bartholin cyst or abscess if:  You are a woman of reproductive age.  You have a history of previous Bartholin cysts or abscesses.  You have diabetes.  You have a sexually transmitted disease (STD). SIGNS AND SYMPTOMS The severity of symptoms varies depending on the size of the cyst and whether it is infected. Symptoms may include:  A bulge or swelling near the lower opening of your vagina.  Discomfort or pain.  Redness.  Pain during sexual intercourse.  Pain when walking.  Fluid draining from the area. DIAGNOSIS Your health care provider may make a diagnosis based on your symptoms and a physical exam. He or she will look for swelling in your vaginal area. Blood tests may be done to check for infections. A sample of fluid from the cyst or abscess may also be taken to be tested in a lab. TREATMENT Small cysts that are not infected may not require any treatment. These often go away on their own. Yourhealth care provider will recommend hot baths and the use of warm compresses. These may also be part of the treatment for an abscess.  Treatment options for a large cyst or abscess may include:   Antibiotic medicine.  A surgical procedure to drain the abscess. One of the following procedures may be done:  Incision and drainage. An incision is made in the cyst or abscess so that the fluid drains out. A catheter may be placed inside the cyst so that it does not close and fill up with fluid again. The catheter will be removed after you have a follow-up visit with a specialist (gynecologist).  Marsupialization. The cyst or abscess is opened and kept open by stitching the edges of the skin to the walls of the cyst or abscess. This allows it to continue to drain and not fill up with fluid again. If you have cysts or abscesses that keep returning and have required incision and drainage multiple times, your health care provider may talk to you about surgery to remove the Bartholin gland. HOME CARE INSTRUCTIONS  Take medicines only as directed by your health care provider.  If you were prescribed an antibiotic medicine, finish it all even if you start to feel better.  Apply warm, wet compresses to the area or take warm, shallow baths that cover your pelvic region (sitz baths) several times a day or as directed by your health care provider.  Do not squeeze the cyst or apply heavy pressure to it.  Do not have sexual intercourse until the cyst has gone away.  If your cyst or abscess was   opened, a small piece of gauze or a drain may have been placed in the area to allow drainage. Do not remove the gauze or the drain until directed by your health care provider.  Wear feminine pads--not tampons--as needed for any drainage or bleeding.  Keep all follow-up visits as directed by your health care provider. This is important. PREVENTION Take these steps to help prevent a Bartholin cyst from returning:  Practice good hygiene.   Clean your vaginal area with mild soap and a soft cloth when you bathe.  Practice safe sex to prevent  STDs. SEEK MEDICAL CARE IF:  You have increased pain, swelling, or redness in the area of the cyst.  Puslike drainage is coming from the cyst.  You have a fever.   This information is not intended to replace advice given to you by your health care provider. Make sure you discuss any questions you have with your health care provider.   Document Released: 04/25/2005 Document Revised: 05/16/2014 Document Reviewed: 12/09/2013 Elsevier Interactive Patient Education 2016 Elsevier Inc.  

## 2015-07-08 ENCOUNTER — Encounter: Payer: Self-pay | Admitting: Obstetrics

## 2015-07-08 ENCOUNTER — Ambulatory Visit: Payer: Commercial Managed Care - PPO | Admitting: Obstetrics

## 2015-07-08 NOTE — Progress Notes (Signed)
Patient ID: Kirsten Thomas, female   DOB: 02/21/1976, 40 y.o.   MRN: [id number]  Chief Complaint  Patient presents with  . Personal Problem    ? cyst in vagina    HPI Kirsten Thomas is a 40 y.o. female.  Evaluated at Urgent Care and found to have " cyst " in lower outer vaginal area.  Not painful, and now seems to be draining and getting smaller.  Has never had anything like this before.  HPI  Past Medical History  Diagnosis Date  . PMDD (premenstrual dysphoric disorder)   . Anxiety   . GERD (gastroesophageal reflux disease)     occasional with food  . Headache(784.0)     migraines    Past Surgical History  Procedure Laterality Date  . No past surgeries    . Cystoscopy with retrograde pyelogram, ureteroscopy and stent placement Left 03/07/2013    Procedure: CYSTOSCOPY WITH RETROGRADE PYELOGRAM, URETEROSCOPY, STONE REMOVAL ;  Surgeon: Crecencio Mc, MD;  Location: [facility];  Service: Urology;  Laterality: Left;  . Holmium laser application Left 03/07/2013    Procedure:  HOLMIUM LASER APPLICATION;  Surgeon: Crecencio Mc, MD;  Location: [facility];  Service: Urology;  Laterality: Left;    History reviewed. No pertinent family history.  Social History Social History  Substance Use Topics  . Smoking status: Never Smoker   . Smokeless tobacco: Never Used  . Alcohol Use: 0.0 oz/week    0 Standard drinks or equivalent per week     Comment: socially    Allergies  Allergen Reactions  . Penicillins Hives    Has patient had a PCN reaction causing immediate rash, facial/tongue/throat swelling, SOB or lightheadedness with hypotension: Yes Has patient had a PCN reaction causing severe rash involving mucus membranes or skin necrosis: No Has patient had a PCN reaction that required hospitalization No Has patient had a PCN reaction occurring within the last 10 years: No If all of the above answers are "NO", then may proceed with Cephalosporin use.     Current Outpatient Prescriptions   Medication Sig Dispense Refill  . Dapsone (ACZONE) 5 % topical gel Apply 1 application topically daily. Reported on 04/29/2015    . FLUoxetine (PROZAC) 20 MG tablet Take 20 mg by mouth daily. Reported on 04/29/2015    . clindamycin (CLEOCIN) 300 MG capsule Take 1 capsule (300 mg total) by mouth 3 (three) times daily. 21 capsule 2  . fluconazole (DIFLUCAN) 150 MG tablet Take 1 tablet (150 mg total) by mouth once. 1 tablet 2  . Prenatal Vit-Fe Fumarate-FA (PRENATAL MULTIVITAMIN) TABS tablet Take 1 tablet by mouth daily at 12 noon. Reported on 07/07/2015     No current facility-administered medications for this visit.    Review of Systems Review of Systems Constitutional: negative for fatigue and weight loss Respiratory: negative for cough and wheezing Cardiovascular: negative for chest pain, fatigue and palpitations Gastrointestinal: negative for abdominal pain and change in bowel habits Genitourinary: positive for cyst in vagina Integument/breast: negative for nipple discharge Musculoskeletal:negative for myalgias Neurological: negative for gait problems and tremors Behavioral/Psych: negative for abusive relationship, depression Endocrine: negative for temperature intolerance     Blood pressure 110/77, pulse 73, weight 214 lb (97.07 kg), last menstrual period 06/19/2015, unknown if currently breastfeeding.  Physical Exam Physical Exam           General:  Alert and no distress Abdomen:  normal findings: no organomegaly, soft, non-tender and no hernia  Pelvis:  External genitalia:  normal general appearance Urinary system: urethral meatus normal and bladder without fullness, nontender Vaginal: swollen, nontender, draining right Bartholin's cyst Cervix: normal appearance Adnexa: normal bimanual exam Uterus: anteverted and non-tender, normal size      Data Reviewed Wet prep  Assessment     Right Bartholin Gland Cyst, spontaneously draining and resolving.     Plan     Clindamycin Rx to complete resolution of Bartholin's Cyst F/U 3 months  No orders of the defined types were placed in this encounter.   Meds ordered this encounter  Medications  . clindamycin (CLEOCIN) 300 MG capsule    Sig: Take 1 capsule (300 mg total) by mouth 3 (three) times daily.    Dispense:  21 capsule    Refill:  2  . fluconazole (DIFLUCAN) 150 MG tablet    Sig: Take 1 tablet (150 mg total) by mouth once.    Dispense:  1 tablet    Refill:  2

## 2015-07-28 ENCOUNTER — Ambulatory Visit: Payer: Commercial Managed Care - PPO | Admitting: Obstetrics

## 2015-08-04 ENCOUNTER — Ambulatory Visit: Payer: Commercial Managed Care - PPO | Admitting: Obstetrics

## 2015-08-13 ENCOUNTER — Ambulatory Visit: Payer: Commercial Managed Care - PPO | Admitting: Obstetrics

## 2015-08-26 ENCOUNTER — Ambulatory Visit (INDEPENDENT_AMBULATORY_CARE_PROVIDER_SITE_OTHER): Payer: Commercial Managed Care - PPO | Admitting: Obstetrics

## 2015-08-26 ENCOUNTER — Encounter: Payer: Self-pay | Admitting: Obstetrics

## 2015-08-26 ENCOUNTER — Encounter: Payer: Self-pay | Admitting: *Deleted

## 2015-08-26 VITALS — BP 109/70 | HR 85 | Wt 224.0 lb

## 2015-08-26 DIAGNOSIS — Z3202 Encounter for pregnancy test, result negative: Secondary | ICD-10-CM

## 2015-08-26 DIAGNOSIS — Z01419 Encounter for gynecological examination (general) (routine) without abnormal findings: Secondary | ICD-10-CM | POA: Diagnosis not present

## 2015-08-26 DIAGNOSIS — Z3041 Encounter for surveillance of contraceptive pills: Secondary | ICD-10-CM

## 2015-08-26 DIAGNOSIS — N946 Dysmenorrhea, unspecified: Secondary | ICD-10-CM

## 2015-08-26 LAB — POCT URINE PREGNANCY: Preg Test, Ur: NEGATIVE

## 2015-08-26 MED ORDER — NORGESTIM-ETH ESTRAD TRIPHASIC 0.18/0.215/0.25 MG-35 MCG PO TABS: 1.0000 | ORAL_TABLET | Freq: Every day | ORAL | Status: DC

## 2015-08-26 MED ORDER — OXYCODONE-ACETAMINOPHEN 10-325 MG PO TABS: 1.0000 | ORAL_TABLET | Freq: Four times a day (QID) | ORAL | Status: DC | PRN

## 2015-08-26 NOTE — Progress Notes (Signed)
Subjective:        Kirsten Thomas is a 40 y.o. female here for a routine exam.  Current complaints: None.    Personal health questionnaire:  Is patient [name], have a family history of breast and/or ovarian cancer: no Is there a family history of uterine cancer diagnosed at age < 1, gastrointestinal cancer, urinary tract cancer, family member who is a Personnel officer syndrome-associated carrier: no Is the patient overweight and hypertensive, family history of diabetes, personal history of gestational diabetes, preeclampsia or PCOS: no Is patient over 18, have PCOS,  family history of premature CHD under age 53, diabetes, smoke, have hypertension or peripheral artery disease:  no At any time, has a partner hit, kicked or otherwise hurt or frightened you?: no Over the past 2 weeks, have you felt down, depressed or hopeless?: no Over the past 2 weeks, have you felt little interest or pleasure in doing things?:no   Gynecologic History Patient's last menstrual period was 01/25/2015. Contraception: OCP (estrogen/progesterone) Last Pap: 2016. Results were: normal Last mammogram: n/a. Results were: n/a  Obstetric History OB History  Gravida Para Term Preterm AB SAB TAB Ectopic Multiple Living  # Outcome Date GA Lbr Len/2nd Weight Sex Delivery Anes PTL Lv  4 Gravida           3 Term 05/13/02 [redacted]w[redacted]d  7 lb 6 oz (3.345 kg) M Vag-Spont None N Y  2 Term 03/26/96 [redacted]w[redacted]d  6 lb 10 oz (3.005 kg) F Vag-Spont EPI N Y  1 Term 07/27/91 [redacted]w[redacted]d  6 lb 10.5 oz (3.019 kg) F Vag-Spont None N Y      Past Medical History  Diagnosis Date  . PMDD (premenstrual dysphoric disorder)   . Anxiety   . GERD (gastroesophageal reflux disease)     occasional with food  . Headache(784.0)     migraines    Past Surgical History  Procedure Laterality Date  . No past surgeries    . Cystoscopy with retrograde pyelogram, ureteroscopy and stent placement Left 03/07/2013    Procedure: CYSTOSCOPY  WITH RETROGRADE PYELOGRAM, URETEROSCOPY, STONE REMOVAL ;  Surgeon: Crecencio Mc, MD;  Location: [facility];  Service: Urology;  Laterality: Left;  . Holmium laser application Left 03/07/2013    Procedure:  HOLMIUM LASER APPLICATION;  Surgeon: Crecencio Mc, MD;  Location: [facility];  Service: Urology;  Laterality: Left;     Current outpatient prescriptions:  .  Dapsone (ACZONE) 5 % topical gel, Apply 1 application topically daily. Reported on 04/29/2015, Disp: , Rfl:  .  FLUoxetine (PROZAC) 20 MG tablet, Take 20 mg by mouth daily. Reported on 04/29/2015, Disp: , Rfl:  .  Prenatal Vit-Fe Fumarate-FA (PRENATAL MULTIVITAMIN) TABS tablet, Take 1 tablet by mouth daily at 12 noon. Reported on 07/07/2015, Disp: , Rfl:  .  Norgestimate-Ethinyl Estradiol Triphasic (TRI-SPRINTEC) 0.18/0.215/0.25 MG-35 MCG tablet, Take 1 tablet by mouth daily., Disp: 1 Package, Rfl: 11 .  oxyCODONE-acetaminophen (PERCOCET) 10-325 MG tablet, Take 1 tablet by mouth every 6 (six) hours as needed for pain., Disp: 40 tablet, Rfl: 0 Allergies  Allergen Reactions  . Penicillins Hives    Has patient had a PCN reaction causing immediate rash, facial/tongue/throat swelling, SOB or lightheadedness with hypotension: Yes Has patient had a PCN reaction causing severe rash involving mucus membranes or skin necrosis: No Has patient had a PCN reaction that required hospitalization No Has patient had a PCN reaction  occurring within the last 10 years: No If all of the above answers are "NO", then may proceed with Cephalosporin use.     Social History  Substance Use Topics  . Smoking status: Never Smoker   . Smokeless tobacco: Never Used  . Alcohol Use: 0.0 oz/week    0 Standard drinks or equivalent per week     Comment: socially    History reviewed. No pertinent family history.    Review of Systems  Constitutional: negative for fatigue and weight loss Respiratory: negative for cough and wheezing Cardiovascular: negative for chest pain,  fatigue and palpitations Gastrointestinal: negative for abdominal pain and change in bowel habits Musculoskeletal:negative for myalgias Neurological:  Positive for occasional Migraine Behavioral/Psych: negative for abusive relationship, depression Endocrine: negative for temperature intolerance   Genitourinary:negative for abnormal menstrual periods, genital lesions, hot flashes, sexual problems and vaginal discharge Integument/breast: negative for breast lump, breast tenderness, nipple discharge and skin lesion(s)    Objective:       BP 109/70 mmHg  Pulse 85  Wt 224 lb (101.606 kg)  LMP 01/25/2015 General:   alert  Skin:   no rash or abnormalities  Lungs:   clear to auscultation bilaterally  Heart:   regular rate and rhythm, S1, S2 normal, no murmur, click, rub or gallop  Breasts:   normal without suspicious masses, skin or nipple changes or axillary nodes  Abdomen:  normal findings: no organomegaly, soft, non-tender and no hernia  Pelvis:  External genitalia: normal general appearance Urinary system: urethral meatus normal and bladder without fullness, nontender Vaginal: normal without tenderness, induration or masses Cervix: normal appearance Adnexa: normal bimanual exam Uterus: anteverted and non-tender, normal size   Lab Review Urine pregnancy test Labs reviewed yes Radiologic studies reviewed no    Assessment:    Healthy female exam.    Contraceptive Surveillance.  Pleased with TriSprintec.  H/O Migraines   Plan:   Tri Research scientist (life sciences)printec Rx Percocet Rx   Education reviewed: calcium supplements, depression evaluation, low fat, low cholesterol diet, safe sex/STD prevention, self breast exams and weight bearing exercise. Contraception: OCP (estrogen/progesterone). Follow up in: 1 year.   Meds ordered this encounter  Medications  . oxyCODONE-acetaminophen (PERCOCET) 10-325 MG tablet    Sig: Take 1 tablet by mouth every 6 (six) hours as needed for pain.    Dispense:  40  tablet    Refill:  0  . Norgestimate-Ethinyl Estradiol Triphasic (TRI-SPRINTEC) 0.18/0.215/0.25 MG-35 MCG tablet    Sig: Take 1 tablet by mouth daily.    Dispense:  1 Package    Refill:  11   Orders Placed This Encounter  Procedures  . NuSwab Vaginitis Plus (VG+)  . POCT urine pregnancy

## 2015-08-28 LAB — PAP IG AND HPV HIGH-RISK
HPV, high-risk: NEGATIVE
PAP Smear Comment: 0

## 2015-08-29 LAB — NUSWAB VAGINITIS PLUS (VG+)
Candida albicans, NAA: NEGATIVE
Candida glabrata, NAA: NEGATIVE
Chlamydia trachomatis, NAA: NEGATIVE
NEISSERIA GONORRHOEAE, NAA: NEGATIVE
TRICH VAG BY NAA: NEGATIVE

## 2015-09-15 ENCOUNTER — Ambulatory Visit: Payer: Commercial Managed Care - PPO | Admitting: Obstetrics

## 2016-08-01 ENCOUNTER — Telehealth: Payer: Self-pay | Admitting: *Deleted

## 2016-08-01 DIAGNOSIS — Z3041 Encounter for surveillance of contraceptive pills: Secondary | ICD-10-CM

## 2016-08-01 MED ORDER — NORGESTIM-ETH ESTRAD TRIPHASIC 0.18/0.215/0.25 MG-35 MCG PO TABS: 1.0000 | ORAL_TABLET | Freq: Every day | ORAL | 2 refills | Status: DC

## 2016-08-01 NOTE — Telephone Encounter (Signed)
Patient misplaced her OCP and needs a refill to last her until her appointment for her annual.

## 2016-10-28 ENCOUNTER — Encounter (HOSPITAL_COMMUNITY): Payer: Self-pay | Admitting: Emergency Medicine

## 2016-10-28 DIAGNOSIS — Z79899 Other long term (current) drug therapy: Secondary | ICD-10-CM | POA: Insufficient documentation

## 2016-10-28 DIAGNOSIS — F419 Anxiety disorder, unspecified: Secondary | ICD-10-CM | POA: Diagnosis not present

## 2016-10-28 DIAGNOSIS — R197 Diarrhea, unspecified: Secondary | ICD-10-CM | POA: Insufficient documentation

## 2016-10-28 LAB — COMPREHENSIVE METABOLIC PANEL
ALBUMIN: 3.7 g/dL (ref 3.5–5.0)
ALT: 33 U/L (ref 14–54)
AST: 37 U/L (ref 15–41)
Alkaline Phosphatase: 49 U/L (ref 38–126)
Anion gap: 13 (ref 5–15)
BUN: 6 mg/dL (ref 6–20)
CO2: 22 mmol/L (ref 22–32)
Calcium: 8.3 mg/dL — ABNORMAL LOW (ref 8.9–10.3)
Chloride: 100 mmol/L — ABNORMAL LOW (ref 101–111)
Creatinine, Ser: 0.71 mg/dL (ref 0.44–1.00)
GFR calc Af Amer: >60 mL/min (ref >60)
GLUCOSE: 94 mg/dL (ref 65–99)
POTASSIUM: 3.1 mmol/L — AB (ref 3.5–5.1)
Sodium: 135 mmol/L (ref 135–145)
TOTAL PROTEIN: 6.6 g/dL (ref 6.5–8.1)
Total Bilirubin: 1 mg/dL (ref 0.3–1.2)

## 2016-10-28 LAB — CBC
HCT: 39.2 % (ref 36.0–46.0)
Hemoglobin: 13.1 g/dL (ref 12.0–15.0)
MCH: 30.2 pg (ref 26.0–34.0)
MCHC: 33.4 g/dL (ref 30.0–36.0)
MCV: 90.3 fL (ref 78.0–100.0)
PLATELETS: 364 10*3/uL (ref 150–400)
RBC: 4.34 MIL/uL (ref 3.87–5.11)
RDW: 12.6 % (ref 11.5–15.5)
WBC: 8.6 10*3/uL (ref 4.0–10.5)

## 2016-10-28 LAB — I-STAT BETA HCG BLOOD, ED (MC, WL, AP ONLY): I-stat hCG, quantitative: <5 m[IU]/mL (ref <5)

## 2016-10-28 LAB — LIPASE, BLOOD: Lipase: 39 U/L (ref 11–51)

## 2016-10-28 NOTE — ED Triage Notes (Signed)
Pt presents to ED for assessment of intermittent bouts of diarrhea "for years".  Patient states she will have episodes of diarrhea for multiple days or up to a week at a time.  Patient states this episode has been going on for 2-3 days with diarrhea after eating or drinking and abdominal pain only right before a BM. Pt states she is also feeling "dry" and has had to drink extra fluid and gatorade today.

## 2016-10-29 ENCOUNTER — Emergency Department (HOSPITAL_COMMUNITY)
Admission: EM | Admit: 2016-10-29 | Discharge: 2016-10-29 | Disposition: A | Discharge status: Home / Self Care | Payer: Commercial Managed Care - PPO | Attending: Emergency Medicine | Admitting: Emergency Medicine

## 2016-10-29 DIAGNOSIS — R197 Diarrhea, unspecified: Secondary | ICD-10-CM

## 2016-10-29 LAB — URINALYSIS, ROUTINE W REFLEX MICROSCOPIC
Bilirubin Urine: NEGATIVE
GLUCOSE, UA: NEGATIVE mg/dL
KETONES UR: NEGATIVE mg/dL
NITRITE: NEGATIVE
Protein, ur: NEGATIVE mg/dL
Specific Gravity, Urine: 1.003 — ABNORMAL LOW (ref 1.005–1.030)
pH: 6 (ref 5.0–8.0)

## 2016-10-29 LAB — MAGNESIUM: Magnesium: 1.6 mg/dL — ABNORMAL LOW (ref 1.7–2.4)

## 2016-10-29 MED ORDER — DIPHENOXYLATE-ATROPINE 2.5-0.025 MG PO TABS
1.0000 | ORAL_TABLET | Freq: Once | ORAL | Status: AC
  Administered 2016-10-29: 1 via ORAL
  Filled 2016-10-29: qty 1

## 2016-10-29 MED ORDER — ONDANSETRON HCL 4 MG/2ML IJ SOLN
4.0000 mg | Freq: Once | INTRAMUSCULAR | Status: DC
  Filled 2016-10-29: qty 2

## 2016-10-29 MED ORDER — DICYCLOMINE HCL 10 MG/ML IM SOLN
20.0000 mg | Freq: Once | INTRAMUSCULAR | Status: AC
  Administered 2016-10-29: 20 mg via INTRAMUSCULAR
  Filled 2016-10-29: qty 2

## 2016-10-29 MED ORDER — MAGNESIUM SULFATE IN D5W 1-5 GM/100ML-% IV SOLN
1.0000 g | Freq: Once | INTRAVENOUS | Status: AC
  Administered 2016-10-29: 1 g via INTRAVENOUS
  Filled 2016-10-29: qty 100

## 2016-10-29 MED ORDER — DICYCLOMINE HCL 20 MG PO TABS: 20.0000 mg | ORAL_TABLET | Freq: Three times a day (TID) | ORAL | 0 refills | Status: AC

## 2016-10-29 MED ORDER — DIPHENOXYLATE-ATROPINE 2.5-0.025 MG PO TABS: 1.0000 | ORAL_TABLET | Freq: Four times a day (QID) | ORAL | 0 refills | Status: AC | PRN

## 2016-10-29 MED ORDER — POTASSIUM CHLORIDE CRYS ER 20 MEQ PO TBCR
40.0000 meq | EXTENDED_RELEASE_TABLET | Freq: Once | ORAL | Status: AC
  Administered 2016-10-29: 40 meq via ORAL
  Filled 2016-10-29: qty 2

## 2016-10-29 MED ORDER — SODIUM CHLORIDE 0.9 % IV BOLUS (SEPSIS)
1000.0000 mL | Freq: Once | INTRAVENOUS | Status: AC
  Administered 2016-10-29: 1000 mL via INTRAVENOUS

## 2016-10-29 MED ORDER — ONDANSETRON 4 MG PO TBDP: 4.0000 mg | ORAL_TABLET | Freq: Three times a day (TID) | ORAL | 0 refills | Status: AC | PRN

## 2016-10-29 NOTE — ED Provider Notes (Signed)
TIME SEEN: 2:11 AM  CHIEF COMPLAINT: Diarrhea  HPI: Patient is a 41 year old female with history of GERD, migraines who presents the emergency department with diarrhea for the past 3 days. States she has had intermittent diarrhea for "years". She states she has flares intermittently. She states that she will have some lower abdominal cramping before she has a bowel movement but no abdominal pain currently. No fevers, chills, nausea, vomiting, dysuria, hematuria, vaginal bleeding or discharge. No bloody stool or melena. Last menstrual period was 2 weeks ago. No previous history of abdominal surgery. No sick contacts, recent travel, and about use or hospitalization. She had a colonoscopy several years ago that she states only showed internal hemorrhoids and this was done with [facility] gastroenterology. She states she feels dizzy and her lips are very dry. She thinks she is dehydrated despite increasing oral fluid intake at home. She states she has not taken Imodium as it causes her to "lock up". She has been taking a probiotic.  ROS: See HPI Constitutional: no fever  Eyes: no drainage  ENT: no runny nose   Cardiovascular:  no chest pain  Resp: no SOB  GI: no vomiting GU: no dysuria Integumentary: no rash  Allergy: no hives  Musculoskeletal: no leg swelling  Neurological: no slurred speech ROS otherwise negative  PAST MEDICAL HISTORY/PAST SURGICAL HISTORY:  Past Medical History:  Diagnosis Date  . Anxiety   . GERD (gastroesophageal reflux disease)    occasional with food  . Headache(784.0)    migraines  . PMDD (premenstrual dysphoric disorder)     MEDICATIONS:  Prior to Admission medications   Medication Sig Start Date End Date Taking? Authorizing Provider  Dapsone (ACZONE) 5 % topical gel Apply 1 application topically daily. Reported on 04/29/2015    [provider]  FLUoxetine (PROZAC) 20 MG tablet Take 20 mg by mouth daily. Reported on 04/29/2015    [provider]   Norgestimate-Ethinyl Estradiol Triphasic (TRI-SPRINTEC) 0.18/0.215/0.25 MG-35 MCG tablet Take 1 tablet by mouth daily. 08/01/16   Brock Bad, MD  oxyCODONE-acetaminophen (PERCOCET) 10-325 MG tablet Take 1 tablet by mouth every 6 (six) hours as needed for pain. 08/26/15   Brock Bad, MD  Prenatal Vit-Fe Fumarate-FA (PRENATAL MULTIVITAMIN) TABS tablet Take 1 tablet by mouth daily at 12 noon. Reported on 07/07/2015    [provider]    ALLERGIES:  Allergies  Allergen Reactions  . Penicillins Hives    Has patient had a PCN reaction causing immediate rash, facial/tongue/throat swelling, SOB or lightheadedness with hypotension: Yes Has patient had a PCN reaction causing severe rash involving mucus membranes or skin necrosis: No Has patient had a PCN reaction that required hospitalization No Has patient had a PCN reaction occurring within the last 10 years: No If all of the above answers are "NO", then may proceed with Cephalosporin use.     SOCIAL HISTORY:  Social History  Substance Use Topics  . Smoking status: Never Smoker  . Smokeless tobacco: Never Used  . Alcohol use 0.0 oz/week     Comment: socially    FAMILY HISTORY: History reviewed. No pertinent family history.  EXAM: BP 132/74   Pulse 70   Temp 98.4 F (36.9 C) (Oral)   Resp 16   LMP 10/07/2016   SpO2 100%  CONSTITUTIONAL: Alert and oriented and responds appropriately to questions. Well-appearing; well-nourished HEAD: Normocephalic EYES: Conjunctivae clear, pupils appear equal, EOMI ENT: normal nose; Dry mucous membranes NECK: Supple, no meningismus, no nuchal rigidity,  no LAD  CARD: RRR; S1 and S2 appreciated; no murmurs, no clicks, no rubs, no gallops RESP: Normal chest excursion without splinting or tachypnea; breath sounds clear and equal bilaterally; no wheezes, no rhonchi, no rales, no hypoxia or respiratory distress, speaking full sentences ABD/GI: Normal bowel sounds; non-distended; soft,  non-tender, no rebound, no guarding, no peritoneal signs, no hepatosplenomegaly BACK:  The back appears normal and is non-tender to palpation, there is no CVA tenderness EXT: Normal ROM in all joints; non-tender to palpation; no edema; normal capillary refill; no cyanosis, no calf tenderness or swelling    SKIN: Normal color for age and race; warm; no rash NEURO: Moves all extremities equally PSYCH: The patient's mood and manner are appropriate. Grooming and personal hygiene are appropriate.  MEDICAL DECISION MAKING: Patient here with intermittent diarrhea. 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 I do not think that this is infectious diarrhea. Her abdominal exam is benign. Doubt colitis, appendicitis, diverticulitis, bowel obstruction. We'll treat with Lomotil, IV fluids, Bentyl. Potassium is on the low side of normal. We'll give oral potassium replacement. I do not feel she needs emergent imaging at this time. Will obtain urinalysis, check magnesium level. We'll fluid challenge patient. Anticipate if workup is unremarkable and she is feeling better that we will discharge her home with outpatient PCP and GI follow-up.  ED PROGRESS: Patient's urine shows small hemoglobin, moderate leukocytes but rare bacteria. She is not having any urinary symptoms including dysuria, hematuria, for with CTR urgency. I do not feel she needs to be on antibiotics but have sent a urine culture. Her magnesium was also slightly low and we have given IV replacement. She reports feeling much better and has been able to drink here without further diarrhea. I feel she is safe to discharge and we will send her with prescriptions for Zofran, Bentyl, Lomotil and give her outpatient GI follow-up. Have advised her to continue taking her probiotic. Discussed return precautions. She is comfortable with this plan.  At this time, I do not feel there is any life-threatening condition present. I have reviewed and discussed all results (EKG, imaging, lab,  urine as appropriate) and exam findings with patient/family. I have reviewed nursing notes and appropriate previous records.  I feel the patient is safe to be discharged home without further emergent workup and can continue workup as an outpatient as needed. Discussed usual and customary return precautions. Patient/family verbalize understanding and are comfortable with this plan.  Outpatient follow-up has been provided if needed. All questions have been answered.    Tristian Bouska, Layla MawKristen N, DO 10/29/16 [phone]

## 2016-10-29 NOTE — ED Notes (Signed)
Pt drank approx 2 full cups of ice water after taking her oral medications. Pt reports no N/V and had no difficulty with drinking.

## 2016-10-30 LAB — URINE CULTURE

## 2016-11-15 ENCOUNTER — Other Ambulatory Visit: Payer: Self-pay | Admitting: Obstetrics

## 2016-11-15 ENCOUNTER — Telehealth: Payer: Self-pay

## 2016-11-15 DIAGNOSIS — Z3041 Encounter for surveillance of contraceptive pills: Secondary | ICD-10-CM

## 2016-11-15 MED ORDER — NORGESTIM-ETH ESTRAD TRIPHASIC 0.18/0.215/0.25 MG-35 MCG PO TABS: 1.0000 | ORAL_TABLET | Freq: Every day | ORAL | 12 refills | Status: DC

## 2016-11-15 NOTE — Telephone Encounter (Signed)
Left mess on VM. 

## 2016-11-22 ENCOUNTER — Ambulatory Visit: Payer: Commercial Managed Care - PPO | Admitting: Obstetrics

## 2016-11-23 ENCOUNTER — Other Ambulatory Visit: Payer: Self-pay | Admitting: Gastroenterology

## 2016-11-23 DIAGNOSIS — R109 Unspecified abdominal pain: Secondary | ICD-10-CM

## 2016-11-23 DIAGNOSIS — R634 Abnormal weight loss: Secondary | ICD-10-CM

## 2016-11-23 DIAGNOSIS — R197 Diarrhea, unspecified: Secondary | ICD-10-CM

## 2016-12-05 ENCOUNTER — Other Ambulatory Visit: Payer: Commercial Managed Care - PPO

## 2016-12-09 ENCOUNTER — Ambulatory Visit: Payer: Commercial Managed Care - PPO | Admitting: Obstetrics

## 2017-03-10 ENCOUNTER — Ambulatory Visit: Payer: Commercial Managed Care - PPO | Admitting: Family Medicine

## 2017-04-09 IMAGING — US US OB TRANSVAGINAL
1 series · 15 of 28 positions shown · non-contrast
Comparison: None.

CLINICAL DATA: Pain in spotting for 1 day.

EXAM:
OBSTETRIC <14 WK US AND TRANSVAGINAL OB US
TECHNIQUE: Both transabdominal and transvaginal ultrasound examinations were
performed for complete evaluation of the gestation as well as the
maternal uterus, adnexal regions, and pelvic cul-de-sac.
Transvaginal technique was performed to assess early pregnancy.

[Series 1: us ob transvaginal · 15 of 33 slices shown]
[im 1/33]
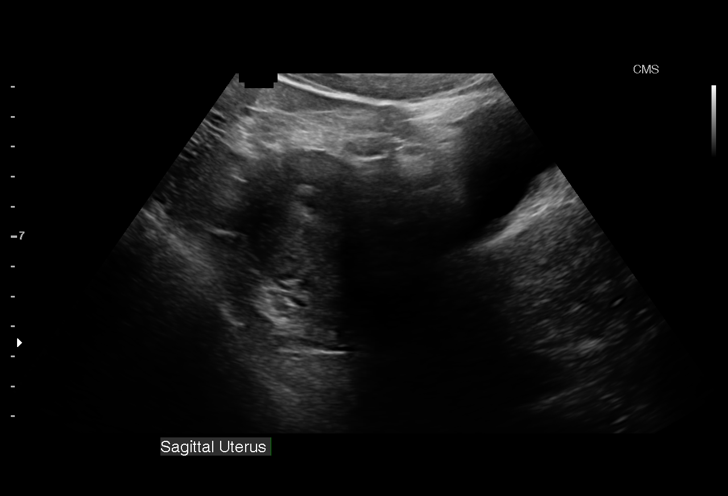
[im 3/33]
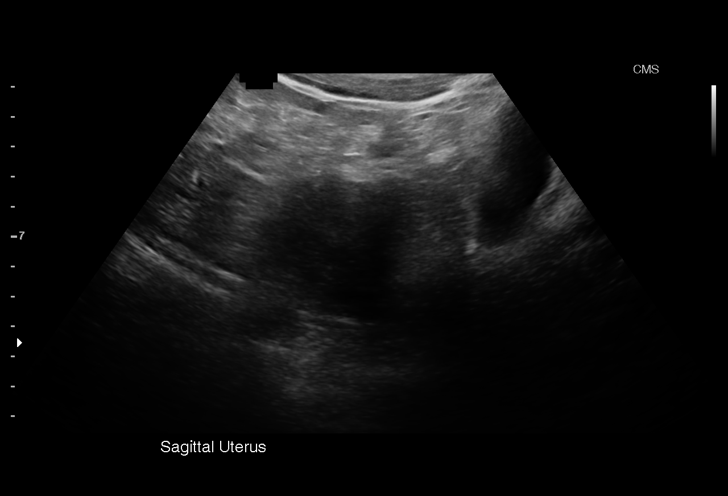
[im 5/33]
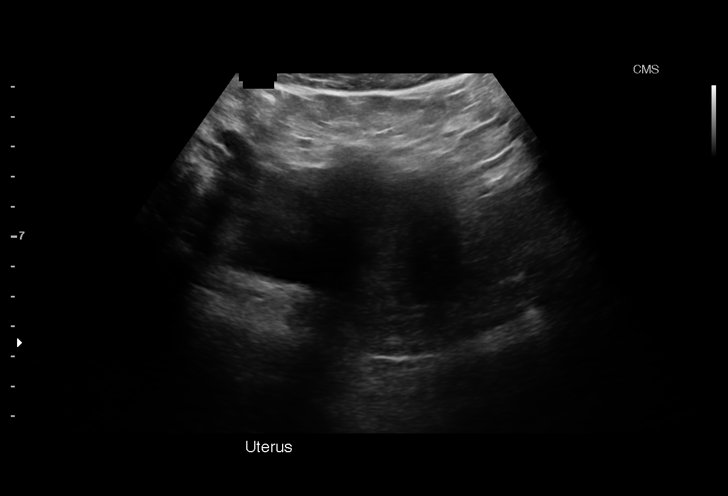
[im 8/33]
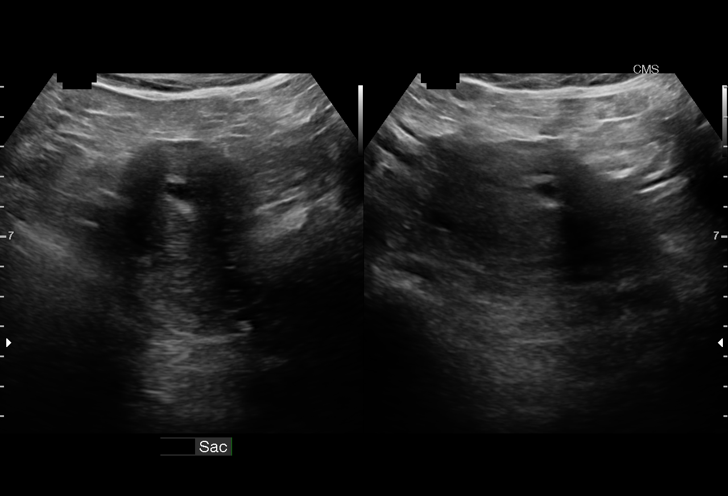
[im 10/33]
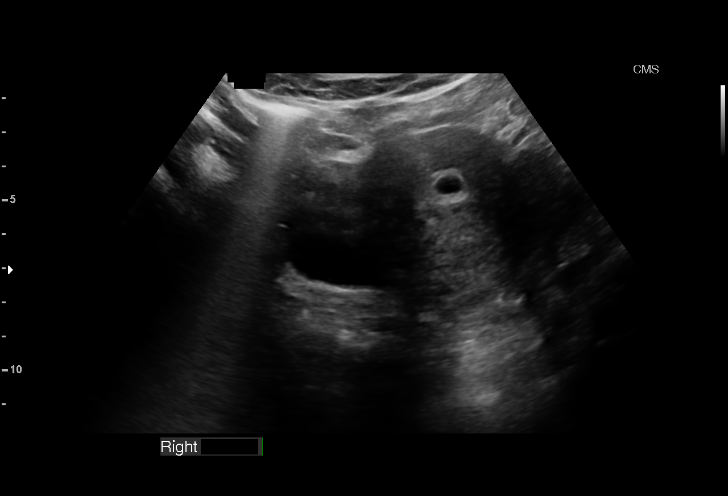
[im 12/33]
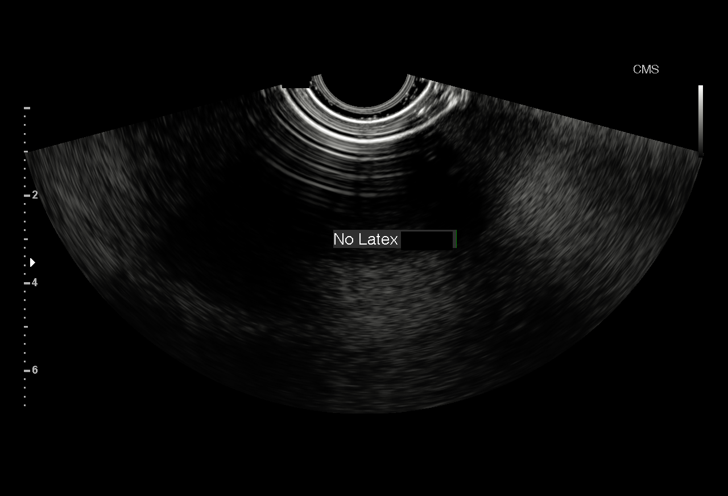
[im 15/33]
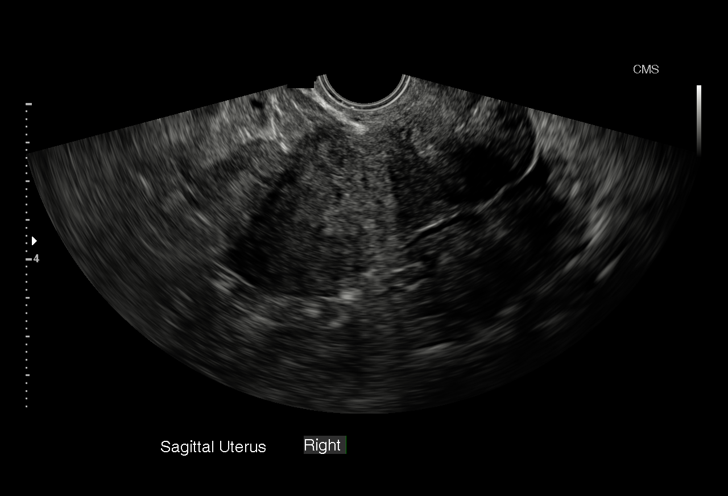
[im 17/33]
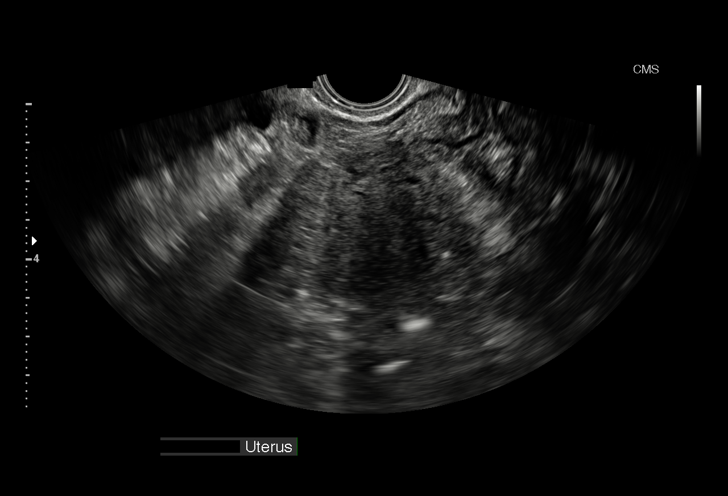
[im 18/33]
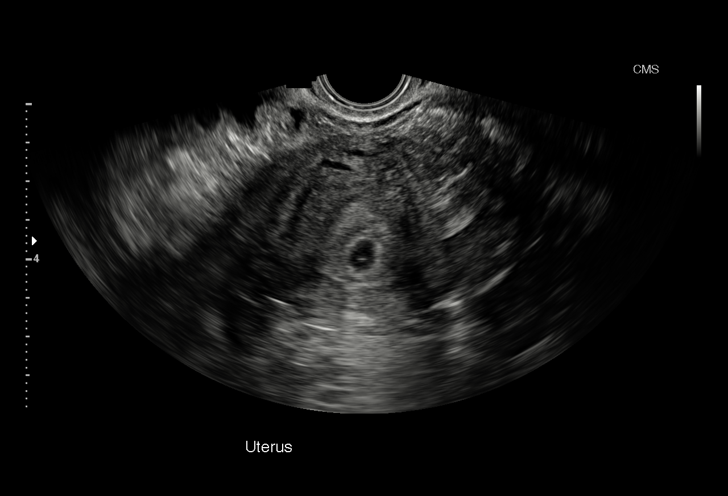
[im 21/33]
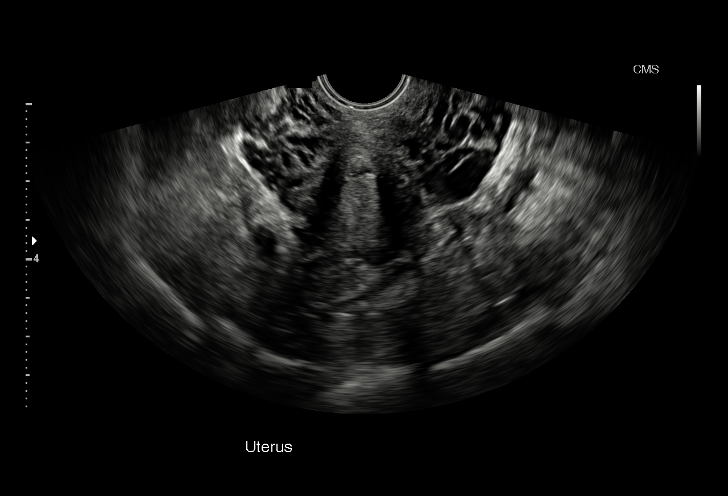
[im 23/33]
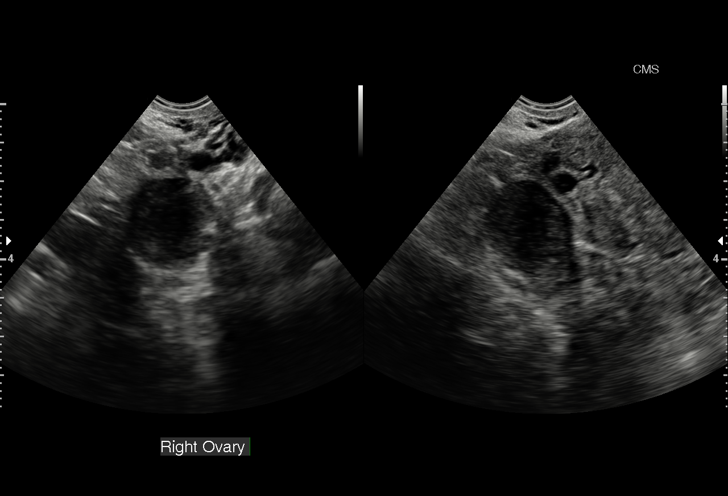
[im 25/33]
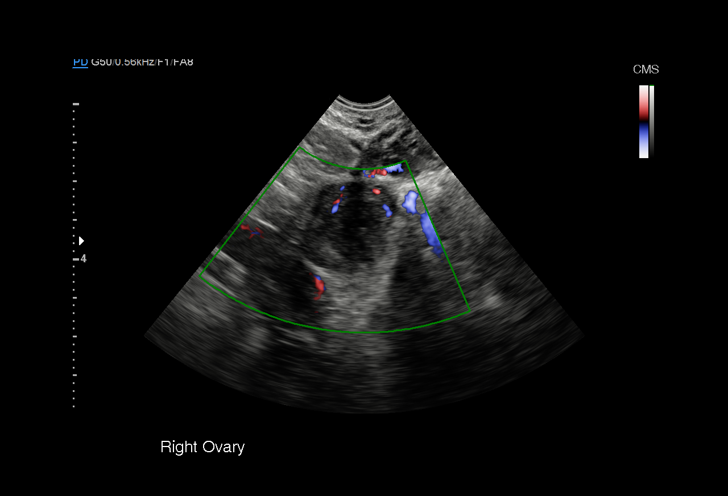
[im 28/33]
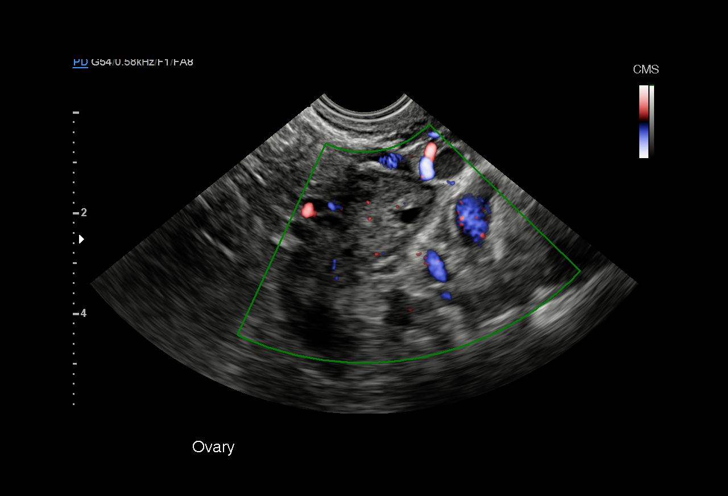
[im 30/33]
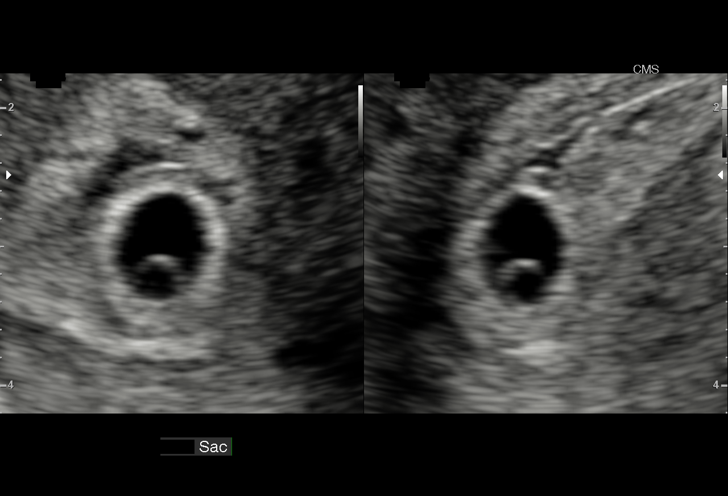
[im 33/33]
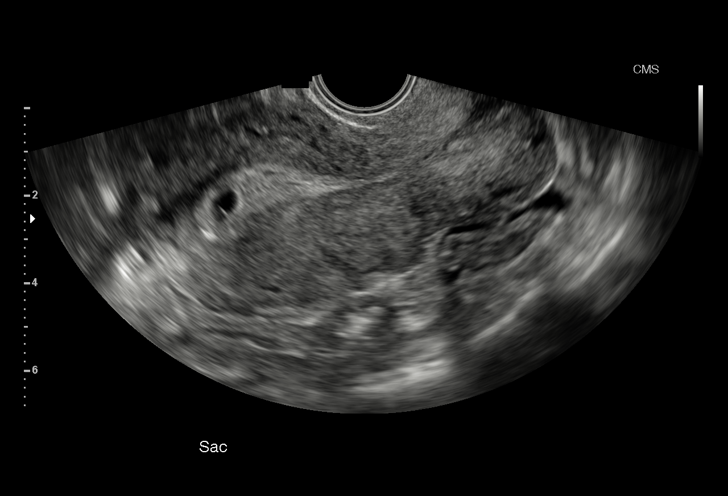

[15 of 28 positions shown; findings below may reference images not displayed]

FINDINGS: Intrauterine gestational sac: Single

Yolk sac:  Yes

Embryo:  No

Cardiac Activity: No

Heart Rate: Not applicable  bpm

MSD: 7.0  mm   5 w   3  d

Maternal uterus/adnexae:

Subchorionic hemorrhage: None

Right ovary: Normal

Left ovary: Normal

Other :None

Free fluid:  Trace free fluid identified within the pelvis
IMPRESSION: 1. Probable early intrauterine gestational sac with yolk sac, but no
fetal pole, or cardiac activity yet visualized. Recommend follow-up
quantitative B-HCG levels and follow-up US in 14 days to confirm and
assess viability. This recommendation follows SRU consensus
guidelines: Diagnostic Criteria for Nonviable Pregnancy Early in the
First Trimester. N Engl J Med 2149; [DATE].
2. Trace free fluid noted.

## 2017-04-28 IMAGING — US US OB TRANSVAGINAL
1 series · 15 of 28 positions shown · non-contrast
Comparison: Ultrasound April 16, 2015.

CLINICAL DATA: Vaginal bleeding, first trimester of pregnancy.

EXAM:
OBSTETRIC <14 WK ULTRASOUND
TECHNIQUE: Transabdominal ultrasound was performed for evaluation of the
gestation as well as the maternal uterus and adnexal regions.

[Series 1: us ob transvaginal · 30 acquisitions, 15 frames shown]
[im 1/30]
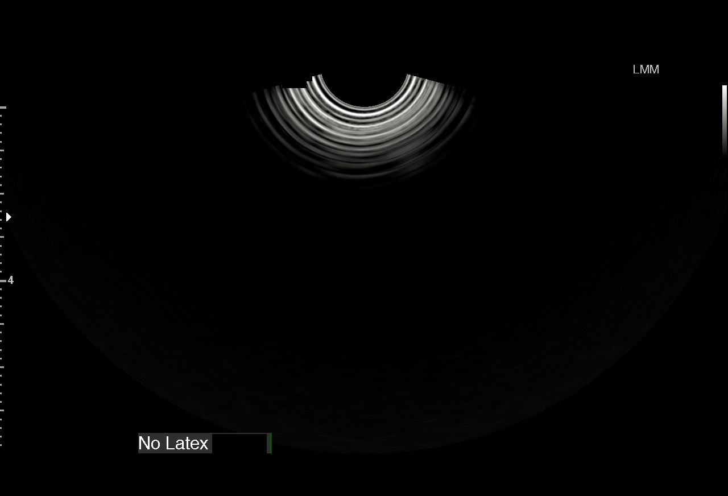
[im 3/30]
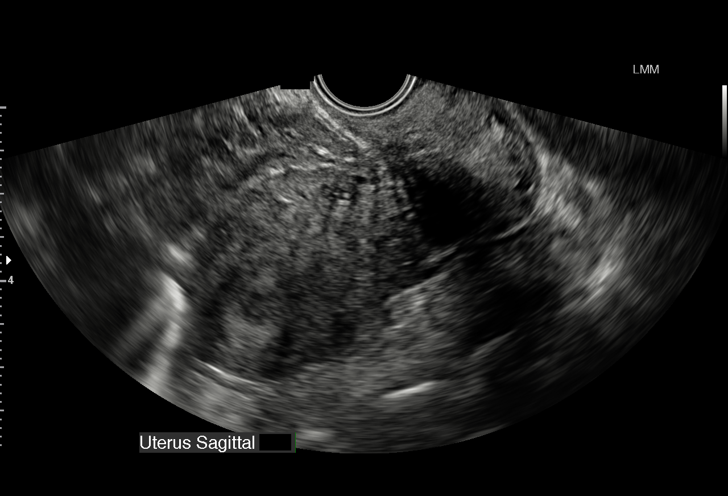
[im 5/30]
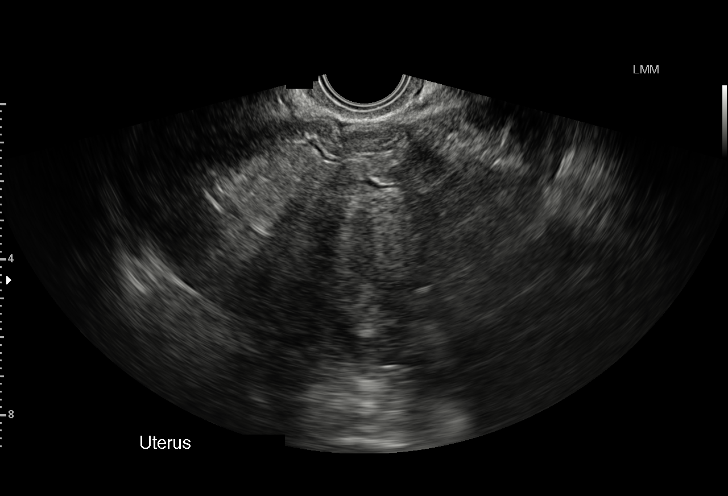
[im 7/30]
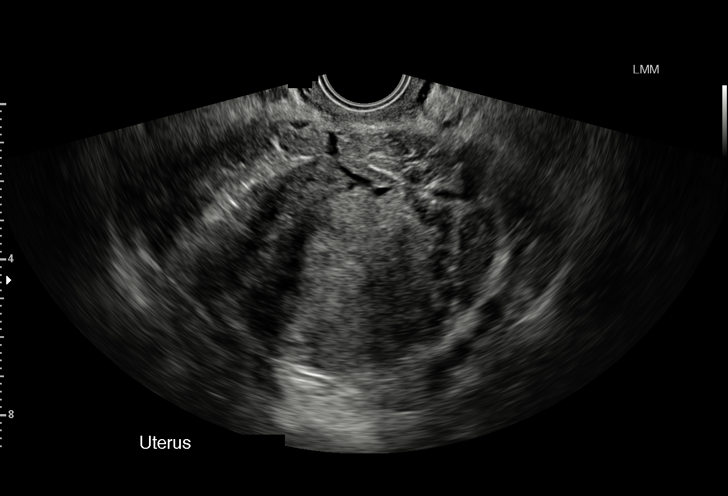
[im 9/30]
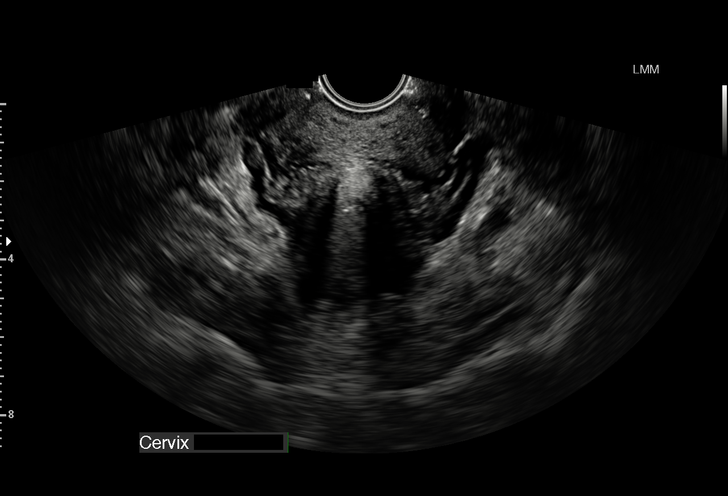
[im 11/30]
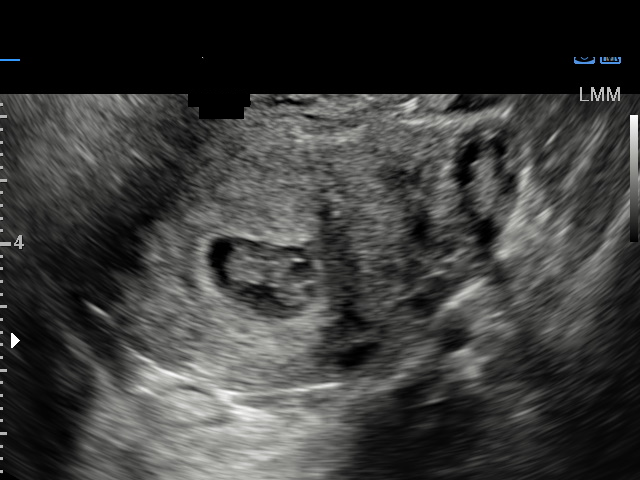
[im 13/30]
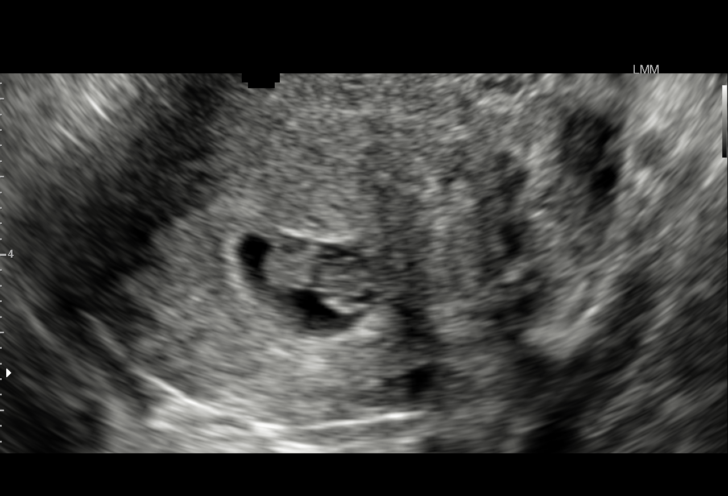
[im 16/30]
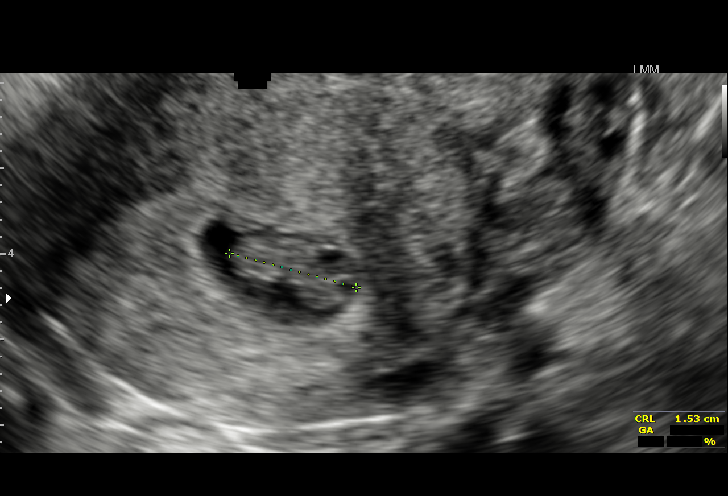
[im 17/30]
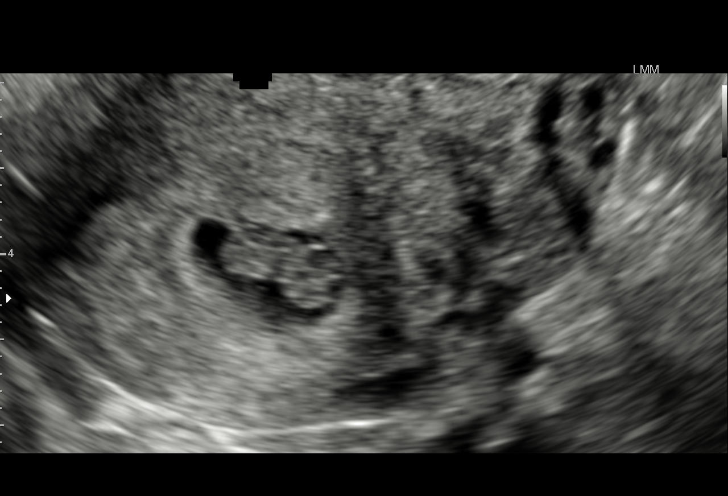
[im 19/30]
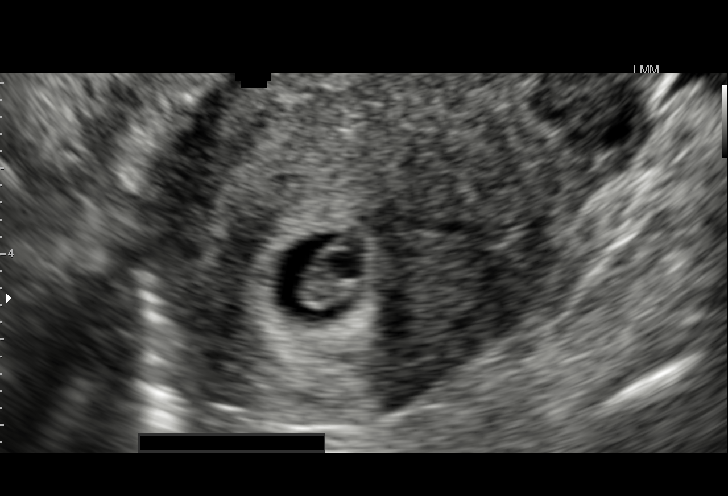
[im 21/30]
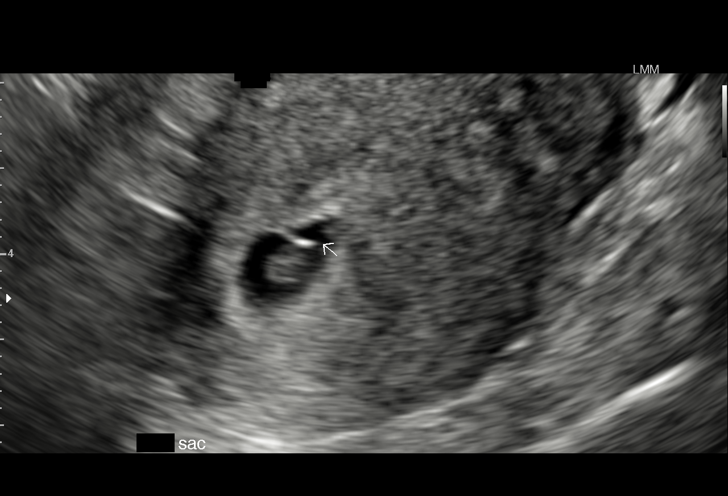
[im 23/30]
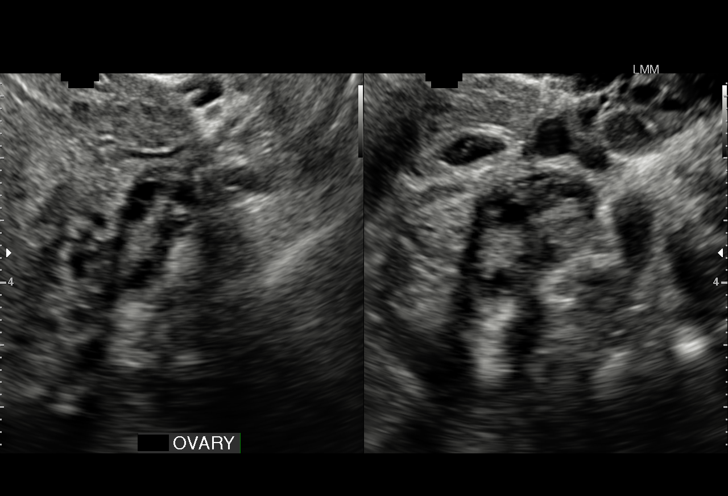
[im 25/30]
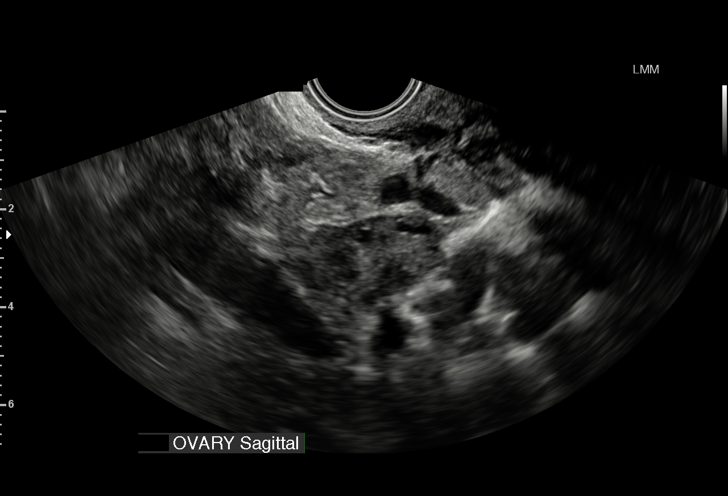
[im 27/30]
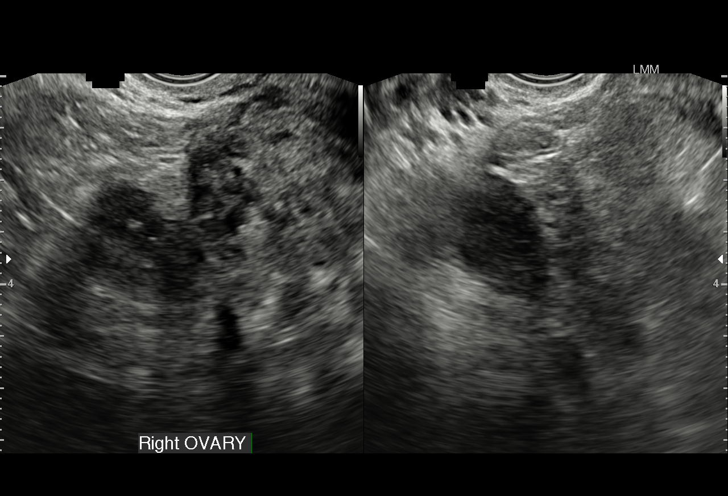
[im 30/30]
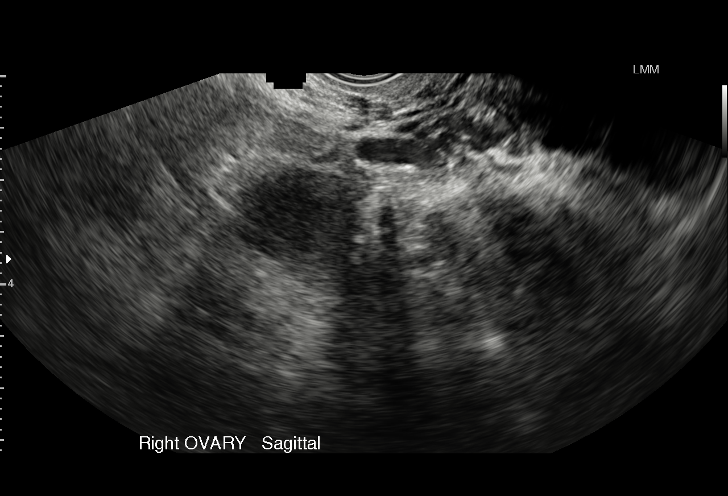

[15 of 28 positions shown; findings below may reference images not displayed]

FINDINGS: Intrauterine gestational sac: Visualized/normal in shape.

Yolk sac:  Visualized.

Embryo:  Visualized.

Cardiac Activity: Visualized.

Heart Rate: 159 bpm

CRL:   16  mm   7 w 6 d                  US EDC: November 29, 2015.

Maternal uterus/adnexae: Ovaries appear normal. No free fluid is
noted.
IMPRESSION: Single live intrauterine gestation of 7 weeks 6 days.

## 2017-11-28 ENCOUNTER — Other Ambulatory Visit: Payer: Self-pay | Admitting: Obstetrics

## 2017-11-28 DIAGNOSIS — Z3041 Encounter for surveillance of contraceptive pills: Secondary | ICD-10-CM

## 2018-01-01 ENCOUNTER — Ambulatory Visit: Payer: Commercial Managed Care - PPO | Admitting: Obstetrics

## 2018-01-01 ENCOUNTER — Encounter: Payer: Self-pay | Admitting: Obstetrics

## 2018-01-01 ENCOUNTER — Ambulatory Visit (INDEPENDENT_AMBULATORY_CARE_PROVIDER_SITE_OTHER): Payer: Commercial Managed Care - PPO | Admitting: Obstetrics

## 2018-01-01 VITALS — BP 117/80 | HR 74 | Ht 63.5 in | Wt 182.4 lb

## 2018-01-01 DIAGNOSIS — Z01419 Encounter for gynecological examination (general) (routine) without abnormal findings: Secondary | ICD-10-CM

## 2018-01-01 DIAGNOSIS — Z1151 Encounter for screening for human papillomavirus (HPV): Secondary | ICD-10-CM

## 2018-01-01 DIAGNOSIS — Z124 Encounter for screening for malignant neoplasm of cervix: Secondary | ICD-10-CM

## 2018-01-01 DIAGNOSIS — Z3202 Encounter for pregnancy test, result negative: Secondary | ICD-10-CM | POA: Diagnosis not present

## 2018-01-01 DIAGNOSIS — Z1239 Encounter for other screening for malignant neoplasm of breast: Secondary | ICD-10-CM

## 2018-01-01 DIAGNOSIS — Z3041 Encounter for surveillance of contraceptive pills: Secondary | ICD-10-CM

## 2018-01-01 DIAGNOSIS — G43839 Menstrual migraine, intractable, without status migrainosus: Secondary | ICD-10-CM

## 2018-01-01 DIAGNOSIS — N946 Dysmenorrhea, unspecified: Secondary | ICD-10-CM

## 2018-01-01 DIAGNOSIS — N898 Other specified noninflammatory disorders of vagina: Secondary | ICD-10-CM

## 2018-01-01 DIAGNOSIS — Z131 Encounter for screening for diabetes mellitus: Secondary | ICD-10-CM

## 2018-01-01 DIAGNOSIS — N939 Abnormal uterine and vaginal bleeding, unspecified: Secondary | ICD-10-CM

## 2018-01-01 DIAGNOSIS — Z0000 Encounter for general adult medical examination without abnormal findings: Secondary | ICD-10-CM

## 2018-01-01 LAB — POCT URINE PREGNANCY: PREG TEST UR: NEGATIVE

## 2018-01-01 MED ORDER — OXYCODONE-ACETAMINOPHEN 10-325 MG PO TABS: 1.0000 | ORAL_TABLET | Freq: Four times a day (QID) | ORAL | 0 refills | Status: DC | PRN

## 2018-01-01 NOTE — Progress Notes (Signed)
Subjective:        Kirsten Thomas is a 42 y.o. female here for a routine exam.  Current complaints: Had episode of prolonged period after running out of OCP's.  Denies intermenstrual bleeding.  Personal health questionnaire:  Is patient [name], have a family history of breast and/or ovarian cancer: no Is there a family history of uterine cancer diagnosed at age < 39, gastrointestinal cancer, urinary tract cancer, family member who is a Personnel officer syndrome-associated carrier: no Is the patient overweight and hypertensive, family history of diabetes, personal history of gestational diabetes, preeclampsia or PCOS: no Is patient over 67, have PCOS,  family history of premature CHD under age 60, diabetes, smoke, have hypertension or peripheral artery disease:  no At any time, has a partner hit, kicked or otherwise hurt or frightened you?: no Over the past 2 weeks, have you felt down, depressed or hopeless?: no Over the past 2 weeks, have you felt little interest or pleasure in doing things?:no   Gynecologic History Patient's last menstrual period was 12/11/2017. Contraception: OCP (estrogen/progesterone) Last Pap: 2018. Results were: normal Last mammogram: n/a. Results were: n/a  Obstetric History OB History  Gravida Para Term Preterm AB Living  4 3 3     3   SAB TAB Ectopic Multiple Live Births          3    # Outcome Date GA Lbr Len/2nd Weight Sex Delivery Anes PTL Lv  4 Gravida           3 Term 05/13/02 [redacted]w[redacted]d  7 lb 6 oz (3.345 kg) M Vag-Spont None N LIV  2 Term 03/26/96 [redacted]w[redacted]d  6 lb 10 oz (3.005 kg) F Vag-Spont EPI N LIV  1 Term 07/27/91 [redacted]w[redacted]d  6 lb 10.5 oz (3.019 kg) F Vag-Spont None N LIV    Past Medical History:  Diagnosis Date  . Anxiety   . GERD (gastroesophageal reflux disease)    occasional with food  . Headache(784.0)    migraines  . PMDD (premenstrual dysphoric disorder)     Past Surgical History:  Procedure Laterality Date  . CYSTOSCOPY WITH RETROGRADE  PYELOGRAM, URETEROSCOPY AND STENT PLACEMENT Left 03/07/2013   Procedure: CYSTOSCOPY WITH RETROGRADE PYELOGRAM, URETEROSCOPY, STONE REMOVAL ;  Surgeon: Crecencio Mc, MD;  Location: [facility];  Service: Urology;  Laterality: Left;  . HOLMIUM LASER APPLICATION Left 03/07/2013   Procedure:  HOLMIUM LASER APPLICATION;  Surgeon: Crecencio Mc, MD;  Location: [facility];  Service: Urology;  Laterality: Left;  . NO PAST SURGERIES       Current Outpatient Medications:  .  ALPRAZolam (XANAX) 1 MG tablet, Take 1 mg by mouth at bedtime as needed for anxiety., Disp: , Rfl:  .  escitalopram (LEXAPRO) 10 MG tablet, Take 10 mg by mouth daily., Disp: , Rfl:  .  TRI-SPRINTEC 0.18/0.215/0.25 MG-35 MCG tablet, TAKE 1 TABLET BY MOUTH EVERY DAY, Disp: 28 tablet, Rfl: 12 .  Dapsone (ACZONE) 5 % topical gel, Apply 1 application topically daily. Reported on 04/29/2015, Disp: , Rfl:  .  dicyclomine (BENTYL) 20 MG tablet, Take 1 tablet (20 mg total) by mouth 3 (three) times daily before meals. As needed for abdominal cramping (Patient not taking: Reported on 01/01/2018), Disp: 30 tablet, Rfl: 0 .  diphenoxylate-atropine (LOMOTIL) 2.5-0.025 MG tablet, Take 1 tablet by mouth 4 (four) times daily as needed for diarrhea or loose stools. (Patient not taking: Reported on 01/01/2018), Disp: 30 tablet, Rfl: 0 .  FLUoxetine (PROZAC) 20 MG  tablet, Take 20 mg by mouth daily. Reported on 04/29/2015, Disp: , Rfl:  .  ondansetron (ZOFRAN ODT) 4 MG disintegrating tablet, Take 1 tablet (4 mg total) by mouth every 8 (eight) hours as needed for nausea or vomiting. (Patient not taking: Reported on 01/01/2018), Disp: 30 tablet, Rfl: 0 .  oxyCODONE-acetaminophen (PERCOCET) 10-325 MG tablet, Take 1 tablet by mouth every 6 (six) hours as needed for pain., Disp: 30 tablet, Rfl: 0 .  Prenatal Vit-Fe Fumarate-FA (PRENATAL MULTIVITAMIN) TABS tablet, Take 1 tablet by mouth daily at 12 noon. Reported on 07/07/2015, Disp: , Rfl:  Allergies  Allergen Reactions  .  Penicillins Hives    Has patient had a PCN reaction causing immediate rash, facial/tongue/throat swelling, SOB or lightheadedness with hypotension: Yes Has patient had a PCN reaction causing severe rash involving mucus membranes or skin necrosis: No Has patient had a PCN reaction that required hospitalization No Has patient had a PCN reaction occurring within the last 10 years: No If all of the above answers are "NO", then may proceed with Cephalosporin use.     Social History   Tobacco Use  . Smoking status: Never Smoker  . Smokeless tobacco: Never Used  Substance Use Topics  . Alcohol use: Yes    Alcohol/week: 0.0 standard drinks    Comment: socially    History reviewed. No pertinent family history.    Review of Systems  Constitutional: negative for fatigue and weight loss Respiratory: negative for cough and wheezing Cardiovascular: negative for chest pain, fatigue and palpitations Gastrointestinal: negative for abdominal pain and change in bowel habits Musculoskeletal:negative for myalgias Neurological: negative for gait problems and tremors Behavioral/Psych: negative for abusive relationship, depression Endocrine: negative for temperature intolerance    Genitourinary:POSITIVE for abnormal menstrual period.  Negative for genital lesions, hot flashes, sexual problems and vaginal discharge Integument/breast: negative for breast lump, breast tenderness, nipple discharge and skin lesion(s)    Objective:       BP 117/80   Pulse 74   Ht 5' 3.5" (1.613 m)   Wt 182 lb 6.4 oz (82.7 kg)   LMP 12/11/2017   BMI 31.80 kg/m  General:   alert  Skin:   no rash or abnormalities  Lungs:   clear to auscultation bilaterally  Heart:   regular rate and rhythm, S1, S2 normal, no murmur, click, rub or gallop  Breasts:   normal without suspicious masses, skin or nipple changes or axillary nodes  Abdomen:  normal findings: no organomegaly, soft, non-tender and no hernia  Pelvis:  External  genitalia: normal general appearance Urinary system: urethral meatus normal and bladder without fullness, nontender Vaginal: normal without tenderness, induration or masses Cervix: normal appearance Adnexa: normal bimanual exam Uterus: anteverted and non-tender, normal size   Lab Review Urine pregnancy test Labs reviewed yes Radiologic studies reviewed no  50% of 20 min visit spent on counseling and coordination of care.   Assessment:     1. Encounter for routine gynecological examination with Papanicolaou smear of cervix  2. Vaginal discharge Rx: - Cervicovaginal ancillary only  3. Encounter for surveillance of contraceptive pills Rx: - Cytology - PAP - POCT urine pregnancy  4. Abnormal uterine bleeding (AUB) Rx: - TSH - CBC  5. Routine adult health maintenance Rx: - Comprehensive metabolic panel  6. Screening for diabetes mellitus Rx: - Hemoglobin A1c  7. Dysmenorrhea - stable  8. Intractable menstrual migraine without status migrainosus Rx: - oxyCODONE-acetaminophen (PERCOCET) 10-325 MG tablet; Take 1 tablet by mouth  every 6 (six) hours as needed for pain.  Dispense: 30 tablet; Refill: 0  9. Screening breast examination Rx: - MM 3D SCREEN BREAST BILATERAL; Future    Plan:    Education reviewed: calcium supplements, depression evaluation, low fat, low cholesterol diet, safe sex/STD prevention, self breast exams and weight bearing exercise. Contraception: OCP (estrogen/progesterone). Mammogram ordered. Follow up in: 1 year.   Meds ordered this encounter  Medications  . oxyCODONE-acetaminophen (PERCOCET) 10-325 MG tablet    Sig: Take 1 tablet by mouth every 6 (six) hours as needed for pain.    Dispense:  30 tablet    Refill:  0   Orders Placed This Encounter  Procedures  . MM 3D SCREEN BREAST BILATERAL    INS: [employer] PF: NONE/ NO NEEDS/ NO IMPLANTS/ NO HX OF BR CA/ FJ/W/OFFICE- DR. HARPERS OFFICE     Standing Status:   Future    Standing Expiration  Date:   03/04/2019    Order Specific Question:   Reason for Exam (SYMPTOM  OR DIAGNOSIS REQUIRED)    Answer:   Screening    Order Specific Question:   Is the patient pregnant?    Answer:   No    Order Specific Question:   Preferred imaging [facility]?    [facility]  . Hemoglobin A1c  . TSH  . Comprehensive metabolic panel  . CBC  . POCT urine pregnancy    Brock BadHARLES A. HARPER MD 01-01-2018

## 2018-01-01 NOTE — Progress Notes (Signed)
Patient is in the office for annual, last pap 08-26-2015. LMP 12-11-17, pt [facility], and her last cycle lasted about 2 weeks. Pt reports some vaginal irritation.

## 2018-01-02 LAB — CBC
HEMATOCRIT: 37.5 % (ref 34.0–46.6)
HEMOGLOBIN: 12.9 g/dL (ref 11.1–15.9)
MCH: 30.9 pg (ref 26.6–33.0)
MCHC: 34.4 g/dL (ref 31.5–35.7)
MCV: 90 fL (ref 79–97)
Platelets: 314 10*3/uL (ref 150–450)
RBC: 4.18 x10E6/uL (ref 3.77–5.28)
RDW: 11.9 % — AB (ref 12.3–15.4)
WBC: 7.7 10*3/uL (ref 3.4–10.8)

## 2018-01-02 LAB — COMPREHENSIVE METABOLIC PANEL
A/G RATIO: 1.8 (ref 1.2–2.2)
ALBUMIN: 4.4 g/dL (ref 3.5–5.5)
ALT: 17 IU/L (ref 0–32)
AST: 18 IU/L (ref 0–40)
Alkaline Phosphatase: 56 IU/L (ref 39–117)
BUN / CREAT RATIO: 21 (ref 9–23)
BUN: 13 mg/dL (ref 6–24)
CALCIUM: 9.4 mg/dL (ref 8.7–10.2)
CO2: 25 mmol/L (ref 20–29)
Chloride: 100 mmol/L (ref 96–106)
Creatinine, Ser: 0.63 mg/dL (ref 0.57–1.00)
GFR, EST AFRICAN AMERICAN: 129 mL/min/{1.73_m2} (ref >59)
GFR, EST NON AFRICAN AMERICAN: 112 mL/min/{1.73_m2} (ref >59)
Globulin, Total: 2.4 g/dL (ref 1.5–4.5)
Glucose: 88 mg/dL (ref 65–99)
POTASSIUM: 4.2 mmol/L (ref 3.5–5.2)
Sodium: 141 mmol/L (ref 134–144)
TOTAL PROTEIN: 6.8 g/dL (ref 6.0–8.5)

## 2018-01-02 LAB — TSH: TSH: 4.37 u[IU]/mL (ref 0.450–4.500)

## 2018-01-02 LAB — HEMOGLOBIN A1C
Est. average glucose Bld gHb Est-mCnc: 108 mg/dL
Hgb A1c MFr Bld: 5.4 % (ref 4.8–5.6)

## 2018-01-02 LAB — CERVICOVAGINAL ANCILLARY ONLY
BACTERIAL VAGINITIS: POSITIVE — AB
CANDIDA VAGINITIS: NEGATIVE

## 2018-01-03 ENCOUNTER — Other Ambulatory Visit: Payer: Self-pay | Admitting: Obstetrics

## 2018-01-03 DIAGNOSIS — N760 Acute vaginitis: Principal | ICD-10-CM

## 2018-01-03 DIAGNOSIS — B9689 Other specified bacterial agents as the cause of diseases classified elsewhere: Secondary | ICD-10-CM

## 2018-01-03 MED ORDER — TINIDAZOLE 500 MG PO TABS
1000.0000 mg | ORAL_TABLET | Freq: Every day | ORAL | 2 refills | Status: DC
Start: 2018-01-03 — End: 2019-03-14

## 2018-01-04 ENCOUNTER — Telehealth: Payer: Self-pay

## 2018-01-04 NOTE — Telephone Encounter (Signed)
PA for Oxycodone was approved [place]- [id number]. [facility] is aware. Unable to notify pt, her VM is full.

## 2018-01-05 LAB — CYTOLOGY - PAP
Diagnosis: NEGATIVE
HPV 16/18/45 genotyping: NEGATIVE
HPV: DETECTED — AB

## 2018-01-31 ENCOUNTER — Ambulatory Visit: Payer: Commercial Managed Care - PPO

## 2018-12-01 ENCOUNTER — Other Ambulatory Visit: Payer: Self-pay | Admitting: Obstetrics

## 2018-12-01 DIAGNOSIS — Z3041 Encounter for surveillance of contraceptive pills: Secondary | ICD-10-CM

## 2019-03-14 ENCOUNTER — Ambulatory Visit
Admission: EM | Admit: 2019-03-14 | Discharge: 2019-03-14 | Disposition: A | Discharge status: Home / Self Care | Payer: Commercial Managed Care - PPO | Attending: Physician Assistant | Admitting: Physician Assistant

## 2019-03-14 DIAGNOSIS — R05 Cough: Secondary | ICD-10-CM | POA: Diagnosis not present

## 2019-03-14 DIAGNOSIS — R059 Cough, unspecified: Secondary | ICD-10-CM

## 2019-03-14 DIAGNOSIS — Z20828 Contact with and (suspected) exposure to other viral communicable diseases: Secondary | ICD-10-CM | POA: Diagnosis not present

## 2019-03-14 DIAGNOSIS — B9689 Other specified bacterial agents as the cause of diseases classified elsewhere: Secondary | ICD-10-CM | POA: Diagnosis not present

## 2019-03-14 DIAGNOSIS — J0140 Acute pansinusitis, unspecified: Secondary | ICD-10-CM

## 2019-03-14 MED ORDER — IPRATROPIUM BROMIDE 0.06 % NA SOLN: 2.0000 | Freq: Four times a day (QID) | NASAL | 0 refills | Status: AC

## 2019-03-14 MED ORDER — FLUTICASONE PROPIONATE 50 MCG/ACT NA SUSP: 2.0000 | Freq: Every day | NASAL | 0 refills | Status: AC

## 2019-03-14 MED ORDER — DOXYCYCLINE HYCLATE 100 MG PO CAPS: 100.0000 mg | ORAL_CAPSULE | Freq: Two times a day (BID) | ORAL | 0 refills | Status: DC

## 2019-03-14 NOTE — ED Triage Notes (Signed)
Pt c/o cough, congestion, sore throat and fatigue x2wks

## 2019-03-14 NOTE — ED Provider Notes (Signed)
[facility]    CSN: [id number] Arrival date & time: 03/14/19  1251      History   Chief Complaint Chief Complaint  Patient presents with  . Cough    HPI Kirsten Thomas is a 43 y.o. female.   43 year old female comes in for 2 week history of URI symptoms.  Cough, congestion, sore throat, fatigue, sinus pressure.  Denies fever, chills, body aches.  Denies shortness of breath, loss of taste or smell.  Denies abdominal pain, nausea, vomiting, diarrhea.  Works from home, no obvious sick/Covid contact.  Never smoker.  Patient denies any worsening of symptoms, states would like Covid testing and therefore came in for evaluation.     Past Medical History:  Diagnosis Date  . Anxiety   . GERD (gastroesophageal reflux disease)    occasional with food  . Headache(784.0)    migraines  . PMDD (premenstrual dysphoric disorder)     There are no active problems to display for this patient.   Past Surgical History:  Procedure Laterality Date  . CYSTOSCOPY WITH RETROGRADE PYELOGRAM, URETEROSCOPY AND STENT PLACEMENT Left 03/07/2013   Procedure: CYSTOSCOPY WITH RETROGRADE PYELOGRAM, URETEROSCOPY, STONE REMOVAL ;  Surgeon: Crecencio Mc, MD;  Location: [facility];  Service: Urology;  Laterality: Left;  . HOLMIUM LASER APPLICATION Left 03/07/2013   Procedure:  HOLMIUM LASER APPLICATION;  Surgeon: Crecencio Mc, MD;  Location: [facility];  Service: Urology;  Laterality: Left;  . NO PAST SURGERIES      OB History    Gravida  4   Para  3   Term  3   Preterm      AB      Living  3     SAB      TAB      Ectopic      Multiple      Live Births  3            Home Medications    Prior to Admission medications   Medication Sig Start Date End Date Taking? Authorizing Provider  dicyclomine (BENTYL) 20 MG tablet Take 1 tablet (20 mg total) by mouth 3 (three) times daily before meals. As needed for abdominal cramping Patient not taking: Reported on 01/01/2018 10/29/16    Ward, Layla Maw, DO  diphenoxylate-atropine (LOMOTIL) 2.5-0.025 MG tablet Take 1 tablet by mouth 4 (four) times daily as needed for diarrhea or loose stools. Patient not taking: Reported on 01/01/2018 10/29/16   Ward, Layla Maw, DO  doxycycline (VIBRAMYCIN) 100 MG capsule Take 1 capsule (100 mg total) by mouth 2 (two) times daily. 03/14/19   Cathie Hoops, Wynell Halberg V, PA-C  escitalopram (LEXAPRO) 10 MG tablet Take 10 mg by mouth daily.    [provider]  fluticasone (FLONASE) 50 MCG/ACT nasal spray Place 2 sprays into both nostrils daily. 03/14/19   Cathie Hoops, Wesson Stith V, PA-C  ipratropium (ATROVENT) 0.06 % nasal spray Place 2 sprays into both nostrils 4 (four) times daily. 03/14/19   Cathie Hoops, Roshard Rezabek V, PA-C  ondansetron (ZOFRAN ODT) 4 MG disintegrating tablet Take 1 tablet (4 mg total) by mouth every 8 (eight) hours as needed for nausea or vomiting. Patient not taking: Reported on 01/01/2018 10/29/16   Ward, Layla Maw, DO  TRI-PREVIFEM 0.18/0.215/0.25 MG-35 MCG tablet TAKE 1 TABLET BY MOUTH EVERY DAY 12/03/18   Brock Bad, MD    Family History No family history on file.  Social History Social History   Tobacco Use  .  Smoking status: Never Smoker  . Smokeless tobacco: Never Used  Substance Use Topics  . Alcohol use: Yes    Alcohol/week: 0.0 standard drinks    Comment: socially  . Drug use: No     Allergies   Penicillins   Review of Systems Review of Systems  Reason unable to perform ROS: See HPI as above.     Physical Exam Triage Vital Signs ED Triage Vitals  Enc Vitals Group     BP 03/14/19 1315 130/85     Pulse Rate 03/14/19 1315 89     Resp 03/14/19 1315 20     Temp 03/14/19 1315 99 F (37.2 C)     Temp Source 03/14/19 1315 Oral     SpO2 03/14/19 1315 96 %     Weight --      Height --      Head Circumference --      Peak Flow --      Pain Score 03/14/19 1316 2     Pain Loc --      Pain Edu? --      Excl. in [place]? --    No data found.  Updated Vital Signs BP 130/85 (BP Location:  Left Arm)   Pulse 89   Temp 99 F (37.2 C) (Oral)   Resp 20   LMP 03/10/2019   SpO2 96%   Physical Exam Constitutional:      General: She is not in acute distress.    Appearance: Normal appearance. She is not ill-appearing, toxic-appearing or diaphoretic.  HENT:     Head: Normocephalic and atraumatic.     Nose:     Right Sinus: No maxillary sinus tenderness or frontal sinus tenderness.     Left Sinus: No maxillary sinus tenderness or frontal sinus tenderness.     Mouth/Throat:     Mouth: Mucous membranes are moist.     Pharynx: Oropharynx is clear. Uvula midline.  Neck:     Musculoskeletal: Normal range of motion and neck supple.  Cardiovascular:     Rate and Rhythm: Normal rate and regular rhythm.     Heart sounds: Normal heart sounds. No murmur. No friction rub. No gallop.   Pulmonary:     Effort: Pulmonary effort is normal. No accessory muscle usage, prolonged expiration, respiratory distress or retractions.     Comments: Lungs clear to auscultation without adventitious lung sounds. Neurological:     General: No focal deficit present.     Mental Status: She is alert and oriented to person, place, and time.      UC Treatments / Results  Labs (all labs ordered are listed, but only abnormal results are displayed) Labs Reviewed  NOVEL CORONAVIRUS, NAA    EKG   Radiology No results found.  Procedures Procedures (including critical care time)  Medications Ordered in UC Medications - No data to display  Initial Impression / Assessment and Plan / UC Course  I have reviewed the triage vital signs and the nursing notes.  Pertinent labs & imaging results that were available during my care of the patient were reviewed by me and considered in my medical decision making (see chart for details).    No alarming signs on exam.  Patient speaking in full sentences without respiratory distress.  COVID testing ordered.  Patient to quarantine until testing results return.   Will start symptomatic treatment at this time.  If symptoms not improving in 2 to 3 days, can start doxycycline to cover for  bacterial infection..  Push fluids.  Return precautions given.  Patient expresses understanding and agrees to plan.  Final Clinical Impressions(s) / UC Diagnoses   Final diagnoses:  Cough  Acute non-recurrent pansinusitis   ED Prescriptions    Medication Sig Dispense Auth. Provider   fluticasone (FLONASE) 50 MCG/ACT nasal spray Place 2 sprays into both nostrils daily. 1 g Raylyn Carton V, PA-C   ipratropium (ATROVENT) 0.06 % nasal spray Place 2 sprays into both nostrils 4 (four) times daily. 15 mL Caswell Alvillar V, PA-C   doxycycline (VIBRAMYCIN) 100 MG capsule Take 1 capsule (100 mg total) by mouth 2 (two) times daily. 20 capsule Belinda FisherYu, Lorenza Winkleman V, PA-C     PDMP not reviewed this encounter.   Belinda FisherYu, Murriel Eidem V, PA-C 03/14/19 1614

## 2019-03-14 NOTE — Discharge Instructions (Signed)
As discussed, cannot rule out COVID. Currently, no alarming signs. Testing ordered. I would like you to quarantine until testing results. Start flonase and atrovent as directed. If symptoms not improving in 2-3 days, start doxycycyline to cover for bacterial sinus infection. If experiencing shortness of breath, trouble breathing, go to the emergency department for further evaluation needed.

## 2019-03-15 LAB — NOVEL CORONAVIRUS, NAA: SARS-CoV-2, NAA: NOT DETECTED

## 2019-09-07 DIAGNOSIS — G629 Polyneuropathy, unspecified: Secondary | ICD-10-CM

## 2019-09-07 HISTORY — DX: Polyneuropathy, unspecified: G62.9

## 2019-12-04 ENCOUNTER — Ambulatory Visit: Payer: Commercial Managed Care - PPO | Admitting: Obstetrics

## 2019-12-13 ENCOUNTER — Ambulatory Visit: Payer: Medicaid Other | Admitting: Obstetrics

## 2019-12-26 ENCOUNTER — Ambulatory Visit: Payer: Medicaid Other | Admitting: Obstetrics

## 2020-01-26 ENCOUNTER — Other Ambulatory Visit: Payer: Self-pay | Admitting: Obstetrics

## 2020-01-26 DIAGNOSIS — Z3041 Encounter for surveillance of contraceptive pills: Secondary | ICD-10-CM

## 2020-02-05 ENCOUNTER — Other Ambulatory Visit: Payer: Self-pay | Admitting: Obstetrics

## 2020-02-05 DIAGNOSIS — Z3041 Encounter for surveillance of contraceptive pills: Secondary | ICD-10-CM

## 2020-02-19 ENCOUNTER — Ambulatory Visit (INDEPENDENT_AMBULATORY_CARE_PROVIDER_SITE_OTHER): Payer: Managed Care, Other (non HMO) | Admitting: Obstetrics

## 2020-02-19 ENCOUNTER — Other Ambulatory Visit (HOSPITAL_COMMUNITY)
Admission: RE | Admit: 2020-02-19 | Discharge: 2020-02-19 | Disposition: A | Discharge status: Home / Self Care | Payer: Managed Care, Other (non HMO) | Source: Ambulatory Visit | Attending: Obstetrics | Admitting: Obstetrics

## 2020-02-19 ENCOUNTER — Encounter: Payer: Self-pay | Admitting: Obstetrics

## 2020-02-19 ENCOUNTER — Other Ambulatory Visit: Payer: Self-pay

## 2020-02-19 VITALS — BP 124/85 | HR 88 | Ht 64.0 in | Wt 199.4 lb

## 2020-02-19 DIAGNOSIS — R87610 Atypical squamous cells of undetermined significance on cytologic smear of cervix (ASC-US): Secondary | ICD-10-CM | POA: Insufficient documentation

## 2020-02-19 DIAGNOSIS — G43831 Menstrual migraine, intractable, with status migrainosus: Secondary | ICD-10-CM

## 2020-02-19 DIAGNOSIS — Z01419 Encounter for gynecological examination (general) (routine) without abnormal findings: Secondary | ICD-10-CM | POA: Diagnosis not present

## 2020-02-19 DIAGNOSIS — Z3041 Encounter for surveillance of contraceptive pills: Secondary | ICD-10-CM | POA: Diagnosis not present

## 2020-02-19 DIAGNOSIS — Z1239 Encounter for other screening for malignant neoplasm of breast: Secondary | ICD-10-CM

## 2020-02-19 DIAGNOSIS — N898 Other specified noninflammatory disorders of vagina: Secondary | ICD-10-CM

## 2020-02-19 NOTE — Progress Notes (Signed)
Subjective:        Kirsten Thomas is a 44 y.o. female here for a routine exam.  Current complaints:  Long and painful periods.  Menstrual headaches.    Personal health questionnaire:  Is patient Kirsten Thomas, have a family history of breast and/or ovarian cancer: no Is there a family history of uterine cancer diagnosed at age < 66, gastrointestinal cancer, urinary tract cancer, family member who is a Personnel officer syndrome-associated carrier: no Is the patient overweight and hypertensive, family history of diabetes, personal history of gestational diabetes, preeclampsia or PCOS: no Is patient over 9, have PCOS,  family history of premature CHD under age 49, diabetes, smoke, have hypertension or peripheral artery disease:  no At any time, has a partner hit, kicked or otherwise hurt or frightened you?: no Over the past 2 weeks, have you felt down, depressed or hopeless?: no Over the past 2 weeks, have you felt little interest or pleasure in doing things?:no   Gynecologic History Patient's last menstrual period was 02/02/2020. Contraception: OCP (estrogen/progesterone) Last Pap: 01-01-2018. Results were: normal Last mammogram: none. Results were: none  Obstetric History OB History  Gravida Para Term Preterm AB Living  4 3 3     3   SAB TAB Ectopic Multiple Live Births          3    # Outcome Date GA Lbr Len/2nd Weight Sex Delivery Anes PTL Lv  4 Gravida           3 Term 05/13/02 [redacted]w[redacted]d  7 lb 6 oz (3.345 kg) M Vag-Spont None N LIV  2 Term 03/26/96 [redacted]w[redacted]d  6 lb 10 oz (3.005 kg) F Vag-Spont EPI N LIV  1 Term 07/27/91 [redacted]w[redacted]d  6 lb 10.5 oz (3.019 kg) F Vag-Spont None N LIV    Past Medical History:  Diagnosis Date  . Anxiety   . GERD (gastroesophageal reflux disease)    occasional with food  . Headache(784.0)    migraines  . Neuropathy 09/2019  . PMDD (premenstrual dysphoric disorder)     Past Surgical History:  Procedure Laterality Date  . CYSTOSCOPY WITH RETROGRADE PYELOGRAM,  URETEROSCOPY AND STENT PLACEMENT Left 03/07/2013   Procedure: CYSTOSCOPY WITH RETROGRADE PYELOGRAM, URETEROSCOPY, STONE REMOVAL ;  Surgeon: 03/09/2013, MD;  Location: [facility];  Service: Urology;  Laterality: Left;  . HOLMIUM LASER APPLICATION Left 03/07/2013   Procedure:  HOLMIUM LASER APPLICATION;  Surgeon: 03/09/2013, MD;  Location: [facility];  Service: Urology;  Laterality: Left;  . NO PAST SURGERIES       Current Outpatient Medications:  .  gabapentin (NEURONTIN) 300 MG capsule, Take 900 mg by mouth 2 (two) times daily. 300mg  in the morning and 600mg  at night, Disp: , Rfl:  .  meloxicam (MOBIC) 15 MG tablet, Take 15 mg by mouth daily as needed for pain., Disp: , Rfl:  .  mirtazapine (REMERON) 45 MG tablet, Take 45 mg by mouth at bedtime., Disp: , Rfl:  .  Norgestimate-Ethinyl Estradiol Triphasic (TRI-ESTARYLLA) 0.18/0.215/0.25 MG-35 MCG tablet, Take 1 tablet by mouth daily., Disp: 84 tablet, Rfl: 4 .  dicyclomine (BENTYL) 20 MG tablet, Take 1 tablet (20 mg total) by mouth 3 (three) times daily before meals. As needed for abdominal cramping (Patient not taking: Reported on 01/01/2018), Disp: 30 tablet, Rfl: 0 .  diphenoxylate-atropine (LOMOTIL) 2.5-0.025 MG tablet, Take 1 tablet by mouth 4 (four) times daily as needed for diarrhea or loose stools. (Patient not taking: Reported on 01/01/2018), Disp:  30 tablet, Rfl: 0 .  doxycycline (VIBRAMYCIN) 100 MG capsule, Take 1 capsule (100 mg total) by mouth 2 (two) times daily. (Patient not taking: Reported on 02/19/2020), Disp: 20 capsule, Rfl: 0 .  escitalopram (LEXAPRO) 10 MG tablet, Take 10 mg by mouth daily. (Patient not taking: Reported on 02/19/2020), Disp: , Rfl:  .  fluticasone (FLONASE) 50 MCG/ACT nasal spray, Place 2 sprays into both nostrils daily. (Patient not taking: Reported on 02/19/2020), Disp: 1 g, Rfl: 0 .  ipratropium (ATROVENT) 0.06 % nasal spray, Place 2 sprays into both nostrils 4 (four) times daily. (Patient not taking: Reported on  02/19/2020), Disp: 15 mL, Rfl: 0 .  ondansetron (ZOFRAN ODT) 4 MG disintegrating tablet, Take 1 tablet (4 mg total) by mouth every 8 (eight) hours as needed for nausea or vomiting. (Patient not taking: Reported on 01/01/2018), Disp: 30 tablet, Rfl: 0 Allergies  Allergen Reactions  . Penicillins Hives    Has patient had a PCN reaction causing immediate rash, facial/tongue/throat swelling, SOB or lightheadedness with hypotension: Yes Has patient had a PCN reaction causing severe rash involving mucus membranes or skin necrosis: No Has patient had a PCN reaction that required hospitalization No Has patient had a PCN reaction occurring within the last 10 years: No If all of the above answers are "NO", then may proceed with Cephalosporin use.     Social History   Tobacco Use  . Smoking status: Never Smoker  . Smokeless tobacco: Never Used  Substance Use Topics  . Alcohol use: Yes    Alcohol/week: 0.0 standard drinks    Comment: socially    History reviewed. No pertinent family history.    Review of Systems  Constitutional: negative for fatigue and weight loss Respiratory: negative for cough and wheezing Cardiovascular: negative for chest pain, fatigue and palpitations Gastrointestinal: negative for abdominal pain and change in bowel habits Musculoskeletal:negative for myalgias Neurological: negative for gait problems and tremors Behavioral/Psych: negative for abusive relationship, depression Endocrine: negative for temperature intolerance    Genitourinary:negative for abnormal menstrual periods, genital lesions, hot flashes, sexual problems and vaginal discharge Integument/breast: negative for breast lump, breast tenderness, nipple discharge and skin lesion(s)    Objective:       BP 124/85   Pulse 88   Ht 5\' 4"  (1.626 m)   Wt 199 lb 6.4 oz (90.4 kg)   LMP 02/02/2020   Breastfeeding No   BMI 34.23 kg/m  General:   alert and no distress  Skin:   no rash or abnormalities   Lungs:   clear to auscultation bilaterally  Heart:   regular rate and rhythm, S1, S2 normal, no murmur, click, rub or gallop  Breasts:   normal without suspicious masses, skin or nipple changes or axillary nodes  Abdomen:  normal findings: no organomegaly, soft, non-tender and no hernia  Pelvis:  External genitalia: normal general appearance Urinary system: urethral meatus normal and bladder without fullness, nontender Vaginal: normal without tenderness, induration or masses Cervix: normal appearance Adnexa: normal bimanual exam Uterus: anteverted and non-tender, normal size   Lab Review Urine pregnancy test Labs reviewed yes Radiologic studies reviewed no  50% of 20 min visit spent on counseling and coordination of care.   Assessment:     1. Encounter for gynecological examination with Papanicolaou smear of cervix Rx: - Cytology - PAP( [place])  2. Vaginal discharge Rx: - Cervicovaginal ancillary only( [place])  3. Encounter for surveillance of contraceptive pills - pleased overall with OCP's, except  the migraines that occur with periods.  Will therefore recommend taking pills continuously  4. Menstrual migraine, with intractable migraine, so stated, with status migrainosus - take active pills continuously to avoid the migraines that occur with estrogen withdrawal  5. Screening breast examination Rx: - MM Digital Screening; Future    Plan:    Education reviewed: calcium supplements, depression evaluation, low fat, low cholesterol diet, safe sex/STD prevention, self breast exams and weight bearing exercise. Contraception: OCP (estrogen/progesterone). Mammogram ordered. Follow up in: 1 year.    Orders Placed This Encounter  Procedures  . MM Digital Screening    Standing Status:   Future    Standing Expiration Date:   02/18/2021    Order Specific Question:   Reason for Exam (SYMPTOM  OR DIAGNOSIS REQUIRED)    Answer:   Screening    Order Specific  Question:   Is the patient pregnant?    Answer:   Yes    Order Specific Question:   Preferred imaging location?    Answer:   [facility]    Brock Bad, MD 02/19/2020 12:28 PM

## 2020-02-19 NOTE — Progress Notes (Signed)
Pt is in the office for annual.  Last pap 01-01-18 Pt states she has never had a mammogram  Pt is on BC pills, LMP 02-02-20

## 2020-02-20 LAB — CERVICOVAGINAL ANCILLARY ONLY
Bacterial Vaginitis (gardnerella): NEGATIVE
Candida Glabrata: NEGATIVE
Candida Vaginitis: NEGATIVE
Chlamydia: NEGATIVE
Comment: NEGATIVE
Comment: NEGATIVE
Comment: NEGATIVE
Comment: NEGATIVE
Comment: NEGATIVE
Comment: NORMAL
Neisseria Gonorrhea: NEGATIVE
Trichomonas: NEGATIVE

## 2020-02-22 LAB — CYTOLOGY - PAP
Comment: NEGATIVE
Diagnosis: UNDETERMINED — AB
High risk HPV: NEGATIVE

## 2020-02-24 ENCOUNTER — Telehealth: Payer: Self-pay

## 2020-02-24 NOTE — Telephone Encounter (Signed)
Pt notified of recent results on pap/swab and to repeat in 1 yr. Pt voiced understanding.

## 2020-02-24 NOTE — Telephone Encounter (Signed)
-----   Message from Brock Bad, MD sent at 02/22/2020 10:07 AM EDT ----- ASCUS with negative HRHPV.  Repeat pap in 1 year.

## 2020-06-18 ENCOUNTER — Encounter: Payer: Self-pay | Admitting: Emergency Medicine

## 2020-06-18 ENCOUNTER — Other Ambulatory Visit: Payer: Self-pay

## 2020-06-18 ENCOUNTER — Ambulatory Visit
Admission: EM | Admit: 2020-06-18 | Discharge: 2020-06-18 | Disposition: A | Discharge status: Home / Self Care | Payer: Managed Care, Other (non HMO) | Attending: Family Medicine | Admitting: Family Medicine

## 2020-06-18 DIAGNOSIS — L89326 Pressure-induced deep tissue damage of left buttock: Secondary | ICD-10-CM | POA: Diagnosis not present

## 2020-06-18 DIAGNOSIS — L89321 Pressure ulcer of left buttock, stage 1: Secondary | ICD-10-CM

## 2020-06-18 MED ORDER — DOXYCYCLINE HYCLATE 100 MG PO CAPS: 100.0000 mg | ORAL_CAPSULE | Freq: Two times a day (BID) | ORAL | 0 refills | Status: AC

## 2020-06-18 MED ORDER — MUPIROCIN 2 % EX OINT: 1.0000 "application " | TOPICAL_OINTMENT | Freq: Two times a day (BID) | CUTANEOUS | 0 refills | Status: AC

## 2020-06-18 NOTE — ED Provider Notes (Signed)
[facility]    CSN: [id number] Arrival date & time: 06/18/20  1807      History   Chief Complaint Chief Complaint  Patient presents with  . Rash    HPI Kirsten Thomas is a 45 y.o. female.   HPI  Patient presents today for evaluation of a wound located on the left inner gluteal fold -upper buttocks region.  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  She reports when at home she does not always sit in a office chair and noticed the wound eruption a little over a week ago. The wound is painless and nondraining.   Past Medical History:  Diagnosis Date  . Anxiety   . GERD (gastroesophageal reflux disease)    occasional with food  . Headache(784.0)    migraines  . Neuropathy 09/2019  . PMDD (premenstrual dysphoric disorder)     There are no problems to display for this patient.   Past Surgical History:  Procedure Laterality Date  . CYSTOSCOPY WITH RETROGRADE PYELOGRAM, URETEROSCOPY AND STENT PLACEMENT Left 03/07/2013   Procedure: CYSTOSCOPY WITH RETROGRADE PYELOGRAM, URETEROSCOPY, STONE REMOVAL ;  Surgeon: Crecencio Mc, MD;  Location: [facility];  Service: Urology;  Laterality: Left;  . HOLMIUM LASER APPLICATION Left 03/07/2013   Procedure:  HOLMIUM LASER APPLICATION;  Surgeon: Crecencio Mc, MD;  Location: [facility];  Service: Urology;  Laterality: Left;  . NO PAST SURGERIES      OB History    Gravida  4   Para  3   Term  3   Preterm      AB      Living  3     SAB      IAB      Ectopic      Multiple      Live Births  3            Home Medications    Prior to Admission medications   Medication Sig Start Date End Date Taking? Authorizing Provider  dicyclomine (BENTYL) 20 MG tablet Take 1 tablet (20 mg total) by mouth 3 (three) times daily before meals. As needed for abdominal cramping Patient not taking: Reported on 01/01/2018 10/29/16   Ward, Layla Maw, DO  diphenoxylate-atropine  (LOMOTIL) 2.5-0.025 MG tablet Take 1 tablet by mouth 4 (four) times daily as needed for diarrhea or loose stools. Patient not taking: Reported on 01/01/2018 10/29/16   Ward, Layla Maw, DO  doxycycline (VIBRAMYCIN) 100 MG capsule Take 1 capsule (100 mg total) by mouth 2 (two) times daily. Patient not taking: Reported on 02/19/2020 03/14/19   Belinda Fisher, PA-C  escitalopram (LEXAPRO) 10 MG tablet Take 10 mg by mouth daily. Patient not taking: Reported on 02/19/2020    [provider]  fluticasone (FLONASE) 50 MCG/ACT nasal spray Place 2 sprays into both nostrils daily. Patient not taking: Reported on 02/19/2020 03/14/19   Belinda Fisher, PA-C  gabapentin (NEURONTIN) 300 MG capsule Take 900 mg by mouth 2 (two) times daily. 300mg  in the morning and 600mg  at night    [provider]  ipratropium (ATROVENT) 0.06 % nasal spray Place 2 sprays into both nostrils 4 (four) times daily. Patient not taking: Reported on 02/19/2020 03/14/19   02/21/2020, PA-C  meloxicam (MOBIC) 15 MG tablet Take 15 mg by mouth daily as needed for pain.    [provider]  mirtazapine (REMERON) 45 MG tablet Take  45 mg by mouth at bedtime.    [provider]  Norgestimate-Ethinyl Estradiol Triphasic (TRI-ESTARYLLA) 0.18/0.215/0.25 MG-35 MCG tablet Take 1 tablet by mouth daily. 02/05/20   Brock Bad, MD  ondansetron (ZOFRAN ODT) 4 MG disintegrating tablet Take 1 tablet (4 mg total) by mouth every 8 (eight) hours as needed for nausea or vomiting. Patient not taking: Reported on 01/01/2018 10/29/16   Ward, Layla Maw, DO    Family History History reviewed. No pertinent family history.  Social History Social History   Tobacco Use  . Smoking status: Never Smoker  . Smokeless tobacco: Never Used  Substance Use Topics  . Alcohol use: Yes    Alcohol/week: 0.0 standard drinks    Comment: socially  . Drug use: No     Allergies   Penicillins   Review of Systems Review of Systems Pertinent  negatives listed in HPI  Physical Exam Triage Vital Signs ED Triage Vitals  Enc Vitals Group     BP 06/18/20 1814 138/83     Pulse Rate 06/18/20 1814 (!) 110     Resp 06/18/20 1814 18     Temp 06/18/20 1814 98.7 F (37.1 C)     Temp Source 06/18/20 1814 Oral     SpO2 06/18/20 1814 96 %     Weight --      Height --      Head Circumference --      Peak Flow --      Pain Score 06/18/20 1822 0     Pain Loc --      Pain Edu? --      Excl. in GC? --    No data found.  Updated Vital Signs BP 138/83 (BP Location: Left Arm)   Pulse (!) 110   Temp 98.7 F (37.1 C) (Oral)   Resp 18   LMP 06/04/2020   SpO2 96%   Visual Acuity Right Eye Distance:   Left Eye Distance:   Bilateral Distance:    Right Eye Near:   Left Eye Near:    Bilateral Near:     Physical Exam General appearance: alert, well developed, well nourished, cooperative  Head: Normocephalic, without obvious abnormality, atraumatic Respiratory: Respirations even and unlabored, normal respiratory rate Heart: Rate and rhythm normal. No gallop or murmurs noted on exam  Abdomen: BS +, no distention, no rebound tenderness, or no mass Extremities: No gross deformities Skin: 7 mm rounded ulcerated lesion (left inner upper gluteal fold), moist wound bed, no odor  Psych: Appropriate mood and affect. Neurologic:   UC Treatments / Results  Labs (all labs ordered are listed, but only abnormal results are displayed) Labs Reviewed - No data to display  EKG   Radiology No results found.  Procedures Procedures (including critical care time)  Medications Ordered in UC Medications - No data to display  Initial Impression / Assessment and Plan / UC Course  I have reviewed the triage vital signs and the nursing notes.  Pertinent labs & imaging results that were available during my care of the patient were reviewed by me and considered in my medical decision making (see chart for details).     Pressure ulceration,  stage I in the gluteal fold located on the left buttocks. Will start patient on doxycycline twice daily for 10 days given the location of the ulceration and risk for infection due to the excess moisture present. Patient given instructions for home wound care management per discharge instructions. Patient advised if wound  has not significantly improved with evidence of healing with decrease in in diameter of wound and dermal layer of skin reappearing, follow-up with her primary care provider as she may require more advanced wound care management. She is not a diabetic or immunocompromised therefore has no apparent underlying diagnoses that would delay wound healing. Patient verbalized understanding and agreement with plan. Final Clinical Impressions(s) / UC Diagnoses   Final diagnoses:  Pressure injury of deep tissue of left buttock     Discharge Instructions     Start doxycycline 100 mg twice daily for the next 10 days. Apply mupirocin ointment twice daily with each dressing change directly to the center of the wound. If the wound has not completely closed or completely healed within 10 days follow-up with your primary care provider for ongoing wound management.    ED Prescriptions    None     PDMP not reviewed this encounter.   Bing Neighbors, [place] 06/20/20 [id number]

## 2020-06-18 NOTE — Discharge Instructions (Addendum)
Start doxycycline 100 mg twice daily for the next 10 days. Apply mupirocin ointment twice daily with each dressing change directly to the center of the wound. If the wound has not completely closed or completely healed within 10 days follow-up with your primary care provider for ongoing wound management.

## 2020-06-18 NOTE — ED Triage Notes (Signed)
Pt here for rash on sacral area onset 2 weeks  Has been using Blue Star cream w/no relief  Also started to use an antifungal OTC cream w/no relief.   Has also been bathing in vinegar to help w/rash w/no relief.   Denies f/v/n/d, new hygiene products, denies pain or itching at the moment.   A&O x4... NAD.Marland Kitchen. ambulatory

## 2020-09-07 ENCOUNTER — Telehealth (INDEPENDENT_AMBULATORY_CARE_PROVIDER_SITE_OTHER): Payer: Managed Care, Other (non HMO) | Admitting: Obstetrics

## 2020-09-07 ENCOUNTER — Encounter: Payer: Self-pay | Admitting: Obstetrics

## 2020-09-07 DIAGNOSIS — N946 Dysmenorrhea, unspecified: Secondary | ICD-10-CM

## 2020-09-07 DIAGNOSIS — Z3041 Encounter for surveillance of contraceptive pills: Secondary | ICD-10-CM

## 2020-09-07 DIAGNOSIS — G43829 Menstrual migraine, not intractable, without status migrainosus: Secondary | ICD-10-CM

## 2020-09-07 MED ORDER — NORGESTIM-ETH ESTRAD TRIPHASIC 0.18/0.215/0.25 MG-35 MCG PO TABS: 1.0000 | ORAL_TABLET | Freq: Every day | ORAL | 4 refills | Status: DC

## 2020-09-07 NOTE — Progress Notes (Signed)
GYNECOLOGY VIRTUAL VISIT ENCOUNTER NOTE  [facility] location: [facility]   Patient location: Home  I connected with Kirsten Thomas on 09/07/20 at  4:15 PM EDT by MyChart Video Encounter and verified that I am speaking with the correct person using two identifiers.   I discussed the limitations, risks, security and privacy concerns of performing an evaluation and management service virtually and the availability of in person appointments. I also discussed with the patient that there may be a patient responsible charge related to this service. The patient expressed understanding and agreed to proceed.   History:  Kirsten Thomas is a 45 y.o. [phone] female being evaluated today for contraceptive management.  She is taking her OCP's continuously taking only the active pills because of menstrual migraines, and is running out of pills with this regimen.  The pharmacist will not give her an extra pack of pills when needed, as the Rx is written. She denies any abnormal vaginal discharge, pelvic pain or other concerns.       Past Medical History:  Diagnosis Date  . Anxiety   . GERD (gastroesophageal reflux disease)    occasional with food  . Headache(784.0)    migraines  . Neuropathy 09/2019  . PMDD (premenstrual dysphoric disorder)    Past Surgical History:  Procedure Laterality Date  . CYSTOSCOPY WITH RETROGRADE PYELOGRAM, URETEROSCOPY AND STENT PLACEMENT Left 03/07/2013   Procedure: CYSTOSCOPY WITH RETROGRADE PYELOGRAM, URETEROSCOPY, STONE REMOVAL ;  Surgeon: Crecencio Mc, MD;  Location: [facility];  Service: Urology;  Laterality: Left;  . HOLMIUM LASER APPLICATION Left 03/07/2013   Procedure:  HOLMIUM LASER APPLICATION;  Surgeon: Crecencio Mc, MD;  Location: [facility];  Service: Urology;  Laterality: Left;  . NO PAST SURGERIES     The following portions of the patient's history were reviewed and updated as appropriate: allergies, current medications, past family  history, past medical history, past social history, past surgical history and problem list.   Health Maintenance:  ASCUS pap and negative HRHPV on 02-19-2020.  Normal mammogram on ?, none listed in chart.   Review of Systems:  Pertinent items noted in HPI and remainder of comprehensive ROS otherwise negative.  Physical Exam:   General:  Alert, oriented and cooperative. Patient appears to be in no acute distress.  Mental Status: Normal mood and affect. Normal behavior. Normal judgment and thought content.   Respiratory: Normal respiratory effort, no problems with respiration noted  Rest of physical exam deferred due to type of encounter  Labs and Imaging No results found for this or any previous visit (from the past 336 hour(s)). No results found.     Assessment and Plan:     1. Encounter for surveillance of contraceptive pills Rx: - Norgestimate-Ethinyl Estradiol Triphasic (TRI-ESTARYLLA) 0.18/0.215/0.25 MG-35 MCG tablet; Take 1 tablet by mouth daily. Patient taking active pills only, continuously.  Dispense: 84 tablet; Refill: 4  2. Menstrual migraine without status migrainosus, not intractable due to estrogen let down during non-active pills - take continuous active pills.  Rx changed at pharmacy to accommodate the extra active pills needed     I discussed the assessment and treatment plan with the patient. The patient was provided an opportunity to ask questions and all were answered. The patient agreed with the plan and demonstrated an understanding of the instructions.   The patient was advised to call back or seek an in-person evaluation/go to the ED if the symptoms worsen or if the  condition fails to improve as anticipated.  I have spent a total of 10 minutes of face-to-face and time, excluding clinical staff time, reviewing notes and preparing to see patient, ordering tests and/or medications, and counseling the patient.   Coral Ceo, MD [facility],  [facility] 09/07/2020 2:50 PM

## 2020-09-09 ENCOUNTER — Ambulatory Visit: Payer: Managed Care, Other (non HMO)

## 2020-11-03 ENCOUNTER — Inpatient Hospital Stay: Admission: RE | Admit: 2020-11-03 | Payer: Managed Care, Other (non HMO) | Source: Ambulatory Visit

## 2020-12-24 ENCOUNTER — Inpatient Hospital Stay: Admission: RE | Admit: 2020-12-24 | Payer: Managed Care, Other (non HMO) | Source: Ambulatory Visit

## 2021-08-16 ENCOUNTER — Other Ambulatory Visit: Payer: Self-pay | Admitting: Obstetrics

## 2021-08-16 DIAGNOSIS — Z3041 Encounter for surveillance of contraceptive pills: Secondary | ICD-10-CM

## 2021-08-17 ENCOUNTER — Other Ambulatory Visit: Payer: Self-pay | Admitting: *Deleted

## 2021-08-17 DIAGNOSIS — Z3041 Encounter for surveillance of contraceptive pills: Secondary | ICD-10-CM

## 2021-08-17 MED ORDER — NORGESTIM-ETH ESTRAD TRIPHASIC 0.18/0.215/0.25 MG-35 MCG PO TABS: ORAL_TABLET | ORAL | 0 refills | Status: DC

## 2022-01-16 ENCOUNTER — Telehealth: Payer: Managed Care, Other (non HMO) | Admitting: Nurse Practitioner

## 2022-01-16 DIAGNOSIS — U071 COVID-19: Secondary | ICD-10-CM | POA: Diagnosis not present

## 2022-01-16 MED ORDER — MOLNUPIRAVIR EUA 200MG CAPSULE: 4.0000 | ORAL_CAPSULE | Freq: Two times a day (BID) | ORAL | 0 refills | Status: AC

## 2022-01-16 MED ORDER — FLUTICASONE PROPIONATE 50 MCG/ACT NA SUSP: 2.0000 | Freq: Every day | NASAL | 0 refills | Status: AC

## 2022-01-16 MED ORDER — PROMETHAZINE-DM 6.25-15 MG/5ML PO SYRP: 5.0000 mL | ORAL_SOLUTION | Freq: Four times a day (QID) | ORAL | 0 refills | Status: AC | PRN

## 2022-01-16 MED ORDER — ALBUTEROL SULFATE HFA 108 (90 BASE) MCG/ACT IN AERS: 2.0000 | INHALATION_SPRAY | Freq: Four times a day (QID) | RESPIRATORY_TRACT | 0 refills | Status: AC | PRN

## 2022-01-16 NOTE — Patient Instructions (Signed)
Kirsten Thomas, thank you for joining Abran Cantor, NP for today's virtual visit.  While this provider is not your primary care provider (PCP), if your PCP is located in our provider database this encounter information will be shared with them immediately following your visit.  Consent: (Patient) Kirsten Thomas provided verbal consent for this virtual visit at the beginning of the encounter.  Current Medications:  Current Outpatient Medications:    fluticasone (FLONASE) 50 MCG/ACT nasal spray, Place 2 sprays into both nostrils daily., Disp: 16 g, Rfl: 0   molnupiravir EUA (LAGEVRIO) 200 mg CAPS capsule, Take 4 capsules (800 mg total) by mouth 2 (two) times daily for 5 days., Disp: 40 capsule, Rfl: 0   promethazine-dextromethorphan (PROMETHAZINE-DM) 6.25-15 MG/5ML syrup, Take 5 mLs by mouth 4 (four) times daily as needed for cough., Disp: 140 mL, Rfl: 0   dicyclomine (BENTYL) 20 MG tablet, Take 1 tablet (20 mg total) by mouth 3 (three) times daily before meals. As needed for abdominal cramping (Patient not taking: Reported on 01/01/2018), Disp: 30 tablet, Rfl: 0   diphenoxylate-atropine (LOMOTIL) 2.5-0.025 MG tablet, Take 1 tablet by mouth 4 (four) times daily as needed for diarrhea or loose stools. (Patient not taking: Reported on 01/01/2018), Disp: 30 tablet, Rfl: 0   doxycycline (VIBRAMYCIN) 100 MG capsule, Take 1 capsule (100 mg total) by mouth 2 (two) times daily., Disp: 20 capsule, Rfl: 0   escitalopram (LEXAPRO) 10 MG tablet, Take 10 mg by mouth daily. (Patient not taking: Reported on 02/19/2020), Disp: , Rfl:    fluticasone (FLONASE) 50 MCG/ACT nasal spray, Place 2 sprays into both nostrils daily. (Patient not taking: Reported on 02/19/2020), Disp: 1 g, Rfl: 0   gabapentin (NEURONTIN) 300 MG capsule, Take 900 mg by mouth 2 (two) times daily. 300mg  in the morning and 600mg  at night, Disp: , Rfl:    ipratropium (ATROVENT) 0.06 % nasal spray, Place 2 sprays into both nostrils 4  (four) times daily. (Patient not taking: Reported on 02/19/2020), Disp: 15 mL, Rfl: 0   meloxicam (MOBIC) 15 MG tablet, Take 15 mg by mouth daily as needed for pain., Disp: , Rfl:    mirtazapine (REMERON) 45 MG tablet, Take 45 mg by mouth at bedtime., Disp: , Rfl:    mupirocin ointment (BACTROBAN) 2 %, Apply 1 application topically 2 (two) times daily., Disp: 30 g, Rfl: 0   Norgestimate-Ethinyl Estradiol Triphasic (TRI-ESTARYLLA) 0.18/0.215/0.25 MG-35 MCG tablet, TAKE 1 TABLET BY MOUTH DAILY. PATIENT TAKING ACTIVE PILLS ONLY, CONTINUOUSLY., Disp: 112 tablet, Rfl: 0   ondansetron (ZOFRAN ODT) 4 MG disintegrating tablet, Take 1 tablet (4 mg total) by mouth every 8 (eight) hours as needed for nausea or vomiting. (Patient not taking: Reported on 01/01/2018), Disp: 30 tablet, Rfl: 0   Medications ordered in this encounter:  Meds ordered this encounter  Medications   molnupiravir EUA (LAGEVRIO) 200 mg CAPS capsule    Sig: Take 4 capsules (800 mg total) by mouth 2 (two) times daily for 5 days.    Dispense:  40 capsule    Refill:  0   promethazine-dextromethorphan (PROMETHAZINE-DM) 6.25-15 MG/5ML syrup    Sig: Take 5 mLs by mouth 4 (four) times daily as needed for cough.    Dispense:  140 mL    Refill:  0   fluticasone (FLONASE) 50 MCG/ACT nasal spray    Sig: Place 2 sprays into both nostrils daily.    Dispense:  16 g    Refill:  0     *  If you need refills on other medications prior to your next appointment, please contact your pharmacy*  Follow-Up: Call back or seek an in-person evaluation if the symptoms worsen or if the condition fails to improve as anticipated.  Other Instructions Take medication as prescribed. Go to the ER if you develop worsening cough, shortness of breath, difficulty breathing, trouble breathing or other concerns. Follow-up with PCP within the next 7 to 10 days for reevaluation.    If you have been instructed to have an in-person evaluation today at a local Urgent Care  facility, please use the link below. It will take you to a list of all of our available [facility], including address, phone number and hours of operation. Please do not delay care.  [facility]  If you or a family member do not have a primary care provider, use the link below to schedule a visit and establish care. When you choose a [facility] primary care physician or advanced practice provider, you gain a long-term partner in health. Find a Primary Care Provider  Learn more about [facility] in-office and virtual care options: [facility] - Get Care Now

## 2022-01-16 NOTE — Progress Notes (Signed)
Virtual Visit Consent   [name], you are scheduled for a virtual visit with a [facility] provider today. Just as with appointments in the office, your consent must be obtained to participate. Your consent will be active for this visit and any virtual visit you may have with one of our providers in the next 365 days. If you have a MyChart account, a copy of this consent can be sent to you electronically.  As this is a virtual visit, video technology does not allow for your provider to perform a traditional examination. This may limit your provider's ability to fully assess your condition. If your provider identifies any concerns that need to be evaluated in person or the need to arrange testing (such as labs, EKG, etc.), we will make arrangements to do so. Although advances in technology are sophisticated, we cannot ensure that it will always work on either your end or our end. If the connection with a video visit is poor, the visit may have to be switched to a telephone visit. With either a video or telephone visit, we are not always able to ensure that we have a secure connection.  By engaging in this virtual visit, you consent to the provision of healthcare and authorize for your insurance to be billed (if applicable) for the services provided during this visit. Depending on your insurance coverage, you may receive a charge related to this service.  I need to obtain your verbal consent now. Are you willing to proceed with your visit today? Kirsten Thomas has provided verbal consent on 01/16/2022 for a virtual visit (video or telephone). Abran Cantor, NP  Date: 01/16/2022 2:06 PM  Virtual Visit via Video Note   I, Kirsten Thomas, connected with  Kirsten Thomas  ([id number], [birth year]) on 01/16/22 at  2:00 PM EDT by a video-enabled telemedicine application and verified that I am speaking with the correct person using two identifiers.  Location: Patient: Virtual Visit  Location Patient: Home Provider: Virtual Visit Location Provider: Home   I discussed the limitations of evaluation and management by telemedicine and the availability of in person appointments. The patient expressed understanding and agreed to proceed.    History of Present Illness: Kirsten Thomas is a 46 y.o. who identifies as a female who was assigned female at birth, and is being seen today for a positive home COVID test 2 days ago. Symptoms started 4 days ago. She reports her husband has tested positive. She reports she has a cough that worsens at night. Patient describes the cough as a "bark". She reports she is coughing so hard, she is incontinent. Coughing worsens with lying flat. She reports postnasal drainage is causing her to cough. She reports she is having some SOB and gets "winded quickly" but denies trouble breathing. She continues to have nasal congestion. She has been taking Coricidin HBP. She has received 2 covid vaccines with a prior history of COVID.   HPI: HPI  Problems: There are no problems to display for this patient.   Allergies:  Allergies  Allergen Reactions   Penicillins Hives    Has patient had a PCN reaction causing immediate rash, facial/tongue/throat swelling, SOB or lightheadedness with hypotension: Yes Has patient had a PCN reaction causing severe rash involving mucus membranes or skin necrosis: No Has patient had a PCN reaction that required hospitalization No Has patient had a PCN reaction occurring within the last 10 years: No If all of the above answers are "  NO", then may proceed with Cephalosporin use.    Medications:  Current Outpatient Medications:    dicyclomine (BENTYL) 20 MG tablet, Take 1 tablet (20 mg total) by mouth 3 (three) times daily before meals. As needed for abdominal cramping (Patient not taking: Reported on 01/01/2018), Disp: 30 tablet, Rfl: 0   diphenoxylate-atropine (LOMOTIL) 2.5-0.025 MG tablet, Take 1 tablet by mouth 4 (four) times  daily as needed for diarrhea or loose stools. (Patient not taking: Reported on 01/01/2018), Disp: 30 tablet, Rfl: 0   doxycycline (VIBRAMYCIN) 100 MG capsule, Take 1 capsule (100 mg total) by mouth 2 (two) times daily., Disp: 20 capsule, Rfl: 0   escitalopram (LEXAPRO) 10 MG tablet, Take 10 mg by mouth daily. (Patient not taking: Reported on 02/19/2020), Disp: , Rfl:    fluticasone (FLONASE) 50 MCG/ACT nasal spray, Place 2 sprays into both nostrils daily. (Patient not taking: Reported on 02/19/2020), Disp: 1 g, Rfl: 0   gabapentin (NEURONTIN) 300 MG capsule, Take 900 mg by mouth 2 (two) times daily. 300mg  in the morning and 600mg  at night, Disp: , Rfl:    ipratropium (ATROVENT) 0.06 % nasal spray, Place 2 sprays into both nostrils 4 (four) times daily. (Patient not taking: Reported on 02/19/2020), Disp: 15 mL, Rfl: 0   meloxicam (MOBIC) 15 MG tablet, Take 15 mg by mouth daily as needed for pain., Disp: , Rfl:    mirtazapine (REMERON) 45 MG tablet, Take 45 mg by mouth at bedtime., Disp: , Rfl:    mupirocin ointment (BACTROBAN) 2 %, Apply 1 application topically 2 (two) times daily., Disp: 30 g, Rfl: 0   Norgestimate-Ethinyl Estradiol Triphasic (TRI-ESTARYLLA) 0.18/0.215/0.25 MG-35 MCG tablet, TAKE 1 TABLET BY MOUTH DAILY. PATIENT TAKING ACTIVE PILLS ONLY, CONTINUOUSLY., Disp: 112 tablet, Rfl: 0   ondansetron (ZOFRAN ODT) 4 MG disintegrating tablet, Take 1 tablet (4 mg total) by mouth every 8 (eight) hours as needed for nausea or vomiting. (Patient not taking: Reported on 01/01/2018), Disp: 30 tablet, Rfl: 0  Observations/Objective: Patient is well-developed, well-nourished in no acute distress.  Resting comfortably at home.  Head is normocephalic, atraumatic.  No labored breathing. S Speech is clear and coherent with logical content.  Patient is alert and oriented at baseline.    Assessment and Plan: 1. Positive self-administered antigen test for COVID-19 - molnupiravir EUA (LAGEVRIO) 200 mg CAPS  capsule; Take 4 capsules (800 mg total) by mouth 2 (two) times daily for 5 days.  Dispense: 40 capsule; Refill: 0 - promethazine-dextromethorphan (PROMETHAZINE-DM) 6.25-15 MG/5ML syrup; Take 5 mLs by mouth 4 (four) times daily as needed for cough.  Dispense: 140 mL; Refill: 0 - fluticasone (FLONASE) 50 MCG/ACT nasal spray; Place 2 sprays into both nostrils daily.  Dispense: 16 g; Refill: 0  Positive home COVID test 2 days ago. Continues to have cough with some SOB. Also reports cough worsens with postnasal drainage. Will start patient on antiviral for COVID along with cough medication and inhaler. Supportive care recommendations were given to the patient, along with strict indications f when to go to ER. Patient verbalizes understanding, all questions were answered.   Follow Up Instructions: I discussed the assessment and treatment plan with the patient. The patient was provided an opportunity to ask questions and all were answered. The patient agreed with the plan and demonstrated an understanding of the instructions.  A copy of instructions were sent to the patient via MyChart unless otherwise noted below.   The patient was advised to call back or seek an  in-person evaluation if the symptoms worsen or if the condition fails to improve as anticipated.  Time:  I spent 10 minutes with the patient via telehealth technology discussing the above problems/concerns.    Abran Cantor, NP

## 2022-01-16 NOTE — Addendum Note (Signed)
Addended by: Abran Cantor on: 01/16/2022 03:12 PM   Modules accepted: Orders

## 2022-04-13 ENCOUNTER — Telehealth (INDEPENDENT_AMBULATORY_CARE_PROVIDER_SITE_OTHER): Payer: Managed Care, Other (non HMO) | Admitting: Obstetrics

## 2022-04-13 DIAGNOSIS — R232 Flushing: Secondary | ICD-10-CM | POA: Diagnosis not present

## 2022-04-13 DIAGNOSIS — Z3041 Encounter for surveillance of contraceptive pills: Secondary | ICD-10-CM

## 2022-04-13 NOTE — Progress Notes (Unsigned)
    GYNECOLOGY VIRTUAL VISIT ENCOUNTER NOTE  Provider location: [facility] at {Blank single:[id number]::"[facility]","[place]","[facility]"}   Patient location: Home  I connected with Kirsten Thomas on 04/13/22 at 10:15 AM EST by MyChart Video Encounter and verified that I am speaking with the correct person using two identifiers.   I discussed the limitations, risks, security and privacy concerns of performing an evaluation and management service virtually and the availability of in person appointments. I also discussed with the patient that there may be a patient responsible charge related to this service. The patient expressed understanding and agreed to proceed.   History:  Kirsten Thomas is a 46 y.o. [phone] female being evaluated today for ***. She denies any abnormal vaginal discharge, bleeding, pelvic pain or other concerns.       Past Medical History:  Diagnosis Date   Anxiety    GERD (gastroesophageal reflux disease)    occasional with food   Headache(784.0)    migraines   Neuropathy 09/2019   PMDD (premenstrual dysphoric disorder)    Past Surgical History:  Procedure Laterality Date   CYSTOSCOPY WITH RETROGRADE PYELOGRAM, URETEROSCOPY AND STENT PLACEMENT Left 03/07/2013   Procedure: CYSTOSCOPY WITH RETROGRADE PYELOGRAM, URETEROSCOPY, STONE REMOVAL ;  Surgeon: Crecencio Mc, MD;  Location: [facility];  Service: Urology;  Laterality: Left;   HOLMIUM LASER APPLICATION Left 03/07/2013   Procedure:  HOLMIUM LASER APPLICATION;  Surgeon: Crecencio Mc, MD;  Location: [facility];  Service: Urology;  Laterality: Left;   NO PAST SURGERIES     The following portions of the patient's history were reviewed and updated as appropriate: allergies, current medications, past family history, past medical history, past social history, past surgical history and problem list.   Health Maintenance:   Normal pap and negative HRHPV on ***.  Normal mammogram on ***.   Review of Systems:  Pertinent items noted in HPI and remainder of comprehensive ROS otherwise negative.  Physical Exam:   General:  Alert, oriented and cooperative. Patient appears to be in no acute distress.  Mental Status: Normal mood and affect. Normal behavior. Normal judgment and thought content.   Respiratory: Normal respiratory effort, no problems with respiration noted  Rest of physical exam deferred due to type of encounter  Labs and Imaging No results found for this or any previous visit (from the past 336 hour(s)). No results found.     Assessment and Plan:     There are no diagnoses linked to this encounter.      I discussed the assessment and treatment plan with the patient. The patient was provided an opportunity to ask questions and all were answered. The patient agreed with the plan and demonstrated an understanding of the instructions.   The patient was advised to call back or seek an in-person evaluation/go to the ED if the symptoms worsen or if the condition fails to improve as anticipated.  I provided *** minutes of face-to-face time during this encounter.   Coral Ceo, MD [facility], [facility]

## 2022-04-14 ENCOUNTER — Encounter: Payer: Self-pay | Admitting: Obstetrics

## 2022-04-14 MED ORDER — NORGESTIM-ETH ESTRAD TRIPHASIC 0.18/0.215/0.25 MG-35 MCG PO TABS: ORAL_TABLET | ORAL | 3 refills | Status: AC

## 2022-04-21 ENCOUNTER — Encounter: Payer: Self-pay | Admitting: Obstetrics

## 2022-04-21 ENCOUNTER — Ambulatory Visit (INDEPENDENT_AMBULATORY_CARE_PROVIDER_SITE_OTHER): Payer: Managed Care, Other (non HMO) | Admitting: Obstetrics

## 2022-04-21 ENCOUNTER — Other Ambulatory Visit (HOSPITAL_COMMUNITY)
Admission: RE | Admit: 2022-04-21 | Discharge: 2022-04-21 | Disposition: A | Discharge status: Home / Self Care | Payer: Managed Care, Other (non HMO) | Source: Ambulatory Visit | Attending: Obstetrics | Admitting: Obstetrics

## 2022-04-21 VITALS — BP 118/78 | HR 88 | Ht 63.5 in | Wt 206.0 lb

## 2022-04-21 DIAGNOSIS — Z01419 Encounter for gynecological examination (general) (routine) without abnormal findings: Secondary | ICD-10-CM | POA: Diagnosis present

## 2022-04-21 DIAGNOSIS — N760 Acute vaginitis: Secondary | ICD-10-CM

## 2022-04-21 DIAGNOSIS — Z1239 Encounter for other screening for malignant neoplasm of breast: Secondary | ICD-10-CM

## 2022-04-21 DIAGNOSIS — N898 Other specified noninflammatory disorders of vagina: Secondary | ICD-10-CM

## 2022-04-21 DIAGNOSIS — B9689 Other specified bacterial agents as the cause of diseases classified elsewhere: Secondary | ICD-10-CM

## 2022-04-21 NOTE — Progress Notes (Signed)
Subjective:        Kirsten Thomas is a 46 y.o. female here for a routine exam.  Current complaints: Malodorous vaginal discharge. .    Personal health questionnaire:  Is patient Kirsten Thomas, have a family history of breast and/or ovarian cancer: no Is there a family history of uterine cancer diagnosed at age < 66, gastrointestinal cancer, urinary tract cancer, family member who is a Personnel officer syndrome-associated carrier: no Is the patient overweight and hypertensive, family history of diabetes, personal history of gestational diabetes, preeclampsia or PCOS: no Is patient over 20, have PCOS,  family history of premature CHD under age 55, diabetes, smoke, have hypertension or peripheral artery disease:  no At any time, has a partner hit, kicked or otherwise hurt or frightened you?: no Over the past 2 weeks, have you felt down, depressed or hopeless?: no Over the past 2 weeks, have you felt little interest or pleasure in doing things?:no   Gynecologic History No LMP recorded. Contraception: OCP (estrogen/progesterone) Last Pap: 2021    Results:  ASCUS with negative High Risk HPV Last mammogram: unknown. Results were: unknown  Obstetric History OB History  Gravida Para Term Preterm AB Living  5 3 3   2 3   SAB IAB Ectopic Multiple Live Births  2       3    # Outcome Date GA Lbr Len/2nd Weight Sex Delivery Anes PTL Lv  5 Term 05/13/02 [redacted]w[redacted]d  7 lb 6 oz (3.345 kg) M Vag-Spont None N LIV  4 Term 03/26/96 [redacted]w[redacted]d  6 lb 10 oz (3.005 kg) F Vag-Spont EPI N LIV  3 Term 07/27/91 [redacted]w[redacted]d  6 lb 10.5 oz (3.019 kg) F Vag-Spont None N LIV  2 SAB           1 SAB             Past Medical History:  Diagnosis Date   Anxiety    GERD (gastroesophageal reflux disease)    occasional with food   Headache(784.0)    migraines   Neuropathy 09/2019   PMDD (premenstrual dysphoric disorder)     Past Surgical History:  Procedure Laterality Date   CYSTOSCOPY WITH RETROGRADE PYELOGRAM, URETEROSCOPY  AND STENT PLACEMENT Left 03/07/2013   Procedure: CYSTOSCOPY WITH RETROGRADE PYELOGRAM, URETEROSCOPY, STONE REMOVAL ;  Surgeon: 03/09/2013, MD;  Location: [facility];  Service: Urology;  Laterality: Left;   HOLMIUM LASER APPLICATION Left 03/07/2013   Procedure:  HOLMIUM LASER APPLICATION;  Surgeon: 03/09/2013, MD;  Location: [facility];  Service: Urology;  Laterality: Left;   NO PAST SURGERIES       Current Outpatient Medications:    albuterol (VENTOLIN HFA) 108 (90 Base) MCG/ACT inhaler, Inhale 2 puffs into the lungs every 6 (six) hours as needed for wheezing or shortness of breath., Disp: 8 g, Rfl: 0   ALPRAZolam (XANAX) 1 MG tablet, Take 1 mg by mouth daily as needed., Disp: , Rfl:    escitalopram (LEXAPRO) 20 MG tablet, Take 20 mg by mouth daily. Take 1.5 tablets daily, Disp: , Rfl:    fluticasone (FLONASE) 50 MCG/ACT nasal spray, Place 2 sprays into both nostrils daily., Disp: 16 g, Rfl: 0   LYRICA 100 MG capsule, , Disp: , Rfl:    metroNIDAZOLE (FLAGYL) 500 MG tablet, Take 1 tablet (500 mg total) by mouth 2 (two) times daily., Disp: 14 tablet, Rfl: 0   Norgestimate-Ethinyl Estradiol Triphasic (TRI-ESTARYLLA) 0.18/0.215/0.25 MG-35 MCG tablet, TAKE 1 TABLET BY MOUTH DAILY.  PATIENT TAKING ACTIVE PILLS ONLY, CONTINUOUSLY., Disp: 112 tablet, Rfl: 3   dicyclomine (BENTYL) 20 MG tablet, Take 1 tablet (20 mg total) by mouth 3 (three) times daily before meals. As needed for abdominal cramping, Disp: 30 tablet, Rfl: 0   diphenoxylate-atropine (LOMOTIL) 2.5-0.025 MG tablet, Take 1 tablet by mouth 4 (four) times daily as needed for diarrhea or loose stools., Disp: 30 tablet, Rfl: 0   doxycycline (VIBRAMYCIN) 100 MG capsule, Take 1 capsule (100 mg total) by mouth 2 (two) times daily., Disp: 20 capsule, Rfl: 0   escitalopram (LEXAPRO) 10 MG tablet, Take 10 mg by mouth daily., Disp: , Rfl:    fluticasone (FLONASE) 50 MCG/ACT nasal spray, Place 2 sprays into both nostrils daily. (Patient not taking: Reported on  02/19/2020), Disp: 1 g, Rfl: 0   gabapentin (NEURONTIN) 300 MG capsule, Take 900 mg by mouth 2 (two) times daily. 300mg  in the morning and 600mg  at night, Disp: , Rfl:    ipratropium (ATROVENT) 0.06 % nasal spray, Place 2 sprays into both nostrils 4 (four) times daily., Disp: 15 mL, Rfl: 0   meloxicam (MOBIC) 15 MG tablet, Take 15 mg by mouth daily as needed for pain., Disp: , Rfl:    mirtazapine (REMERON) 45 MG tablet, Take 45 mg by mouth at bedtime., Disp: , Rfl:    mupirocin ointment (BACTROBAN) 2 %, Apply 1 application topically 2 (two) times daily., Disp: 30 g, Rfl: 0   ondansetron (ZOFRAN ODT) 4 MG disintegrating tablet, Take 1 tablet (4 mg total) by mouth every 8 (eight) hours as needed for nausea or vomiting., Disp: 30 tablet, Rfl: 0   promethazine-dextromethorphan (PROMETHAZINE-DM) 6.25-15 MG/5ML syrup, Take 5 mLs by mouth 4 (four) times daily as needed for cough., Disp: 140 mL, Rfl: 0 Allergies  Allergen Reactions   Penicillins Hives    Has patient had a PCN reaction causing immediate rash, facial/tongue/throat swelling, SOB or lightheadedness with hypotension: Yes Has patient had a PCN reaction causing severe rash involving mucus membranes or skin necrosis: No Has patient had a PCN reaction that required hospitalization No Has patient had a PCN reaction occurring within the last 10 years: No If all of the above answers are "NO", then may proceed with Cephalosporin use.     Social History   Tobacco Use   Smoking status: Never   Smokeless tobacco: Never  Substance Use Topics   Alcohol use: Yes    Alcohol/week: 0.0 standard drinks of alcohol    Comment: socially    History reviewed. No pertinent family history.    Review of Systems  Constitutional: negative for fatigue and weight loss Respiratory: negative for cough and wheezing Cardiovascular: negative for chest pain, fatigue and palpitations Gastrointestinal: negative for abdominal pain and change in bowel  habits Musculoskeletal:negative for myalgias Neurological: negative for gait problems and tremors Behavioral/Psych: negative for abusive relationship, depression Endocrine: negative for temperature intolerance    Genitourinary:negative for abnormal menstrual periods, genital lesions, hot flashes, sexual problems and vaginal discharge Integument/breast: negative for breast lump, breast tenderness, nipple discharge and skin lesion(s)    Objective:       BP 118/78   Pulse 88   Ht 5' 3.5" (1.613 m)   Wt 206 lb (93.4 kg)   BMI 35.92 kg/m  General:   Alert and no distress  Skin:   no rash or abnormalities  Lungs:   clear to auscultation bilaterally  Heart:   regular rate and rhythm, S1, S2 normal, no murmur, click,  rub or gallop  Breasts:   normal without suspicious masses, skin or nipple changes or axillary nodes  Abdomen:  normal findings: no organomegaly, soft, non-tender and no hernia  Pelvis:  External genitalia: normal general appearance Urinary system: urethral meatus normal and bladder without fullness, nontender Vaginal: normal without tenderness, induration or masses Cervix: normal appearance Adnexa: normal bimanual exam Uterus: anteverted and non-tender, normal size   Lab Review Urine pregnancy test Labs reviewed yes Radiologic studies reviewed yes  I have spent a total of 20 minutes of face-to-face time, excluding clinical staff time, reviewing notes and preparing to see patient, ordering tests and/or medications, and counseling the patient.   Assessment:    Healthy female exam.    Plan:    Follow up in 1 year   Meds ordered this encounter  Medications   metroNIDAZOLE (FLAGYL) 500 MG tablet    Sig: Take 1 tablet (500 mg total) by mouth 2 (two) times daily.    Dispense:  14 tablet    Refill:  0   Orders Placed This Encounter  Procedures   MM Digital Screening    Standing Status:   Future    Standing Expiration Date:   04/26/2023    Order Specific  Question:   Reason for Exam (SYMPTOM  OR DIAGNOSIS REQUIRED)    Answer:   Screening    Order Specific Question:   Is the patient pregnant?    Answer:   No    Order Specific Question:   Preferred imaging location?    Answer:   [facility]  04/21/22

## 2022-04-21 NOTE — Progress Notes (Signed)
Patient presents for AEX. Patient complains of having vaginal odor.   Last Pap: 02/19/20 ASCUS Last MM: Never done

## 2022-04-25 LAB — CERVICOVAGINAL ANCILLARY ONLY
Bacterial Vaginitis (gardnerella): NEGATIVE
Candida Glabrata: NEGATIVE
Candida Vaginitis: NEGATIVE
Comment: NEGATIVE
Comment: NEGATIVE
Comment: NEGATIVE

## 2022-04-25 MED ORDER — METRONIDAZOLE 500 MG PO TABS: 500.0000 mg | ORAL_TABLET | Freq: Two times a day (BID) | ORAL | 0 refills | Status: AC

## 2022-04-27 LAB — CYTOLOGY - PAP
Comment: NEGATIVE
Diagnosis: NEGATIVE
High risk HPV: NEGATIVE
